# Patient Record
Sex: Female | Born: 1945
Health system: Southern US, Community
[De-identification: ages and names within clinical notes are randomized; demographics above are authoritative.]

## PROBLEM LIST (undated history)

## (undated) DIAGNOSIS — M5136 Other intervertebral disc degeneration, lumbar region: Secondary | ICD-10-CM

## (undated) DIAGNOSIS — F419 Anxiety disorder, unspecified: Secondary | ICD-10-CM

## (undated) DIAGNOSIS — M5126 Other intervertebral disc displacement, lumbar region: Secondary | ICD-10-CM

## (undated) DIAGNOSIS — M1990 Unspecified osteoarthritis, unspecified site: Secondary | ICD-10-CM

## (undated) DIAGNOSIS — F32A Depression, unspecified: Secondary | ICD-10-CM

## (undated) DIAGNOSIS — L719 Rosacea, unspecified: Secondary | ICD-10-CM

## (undated) DIAGNOSIS — I10 Essential (primary) hypertension: Secondary | ICD-10-CM

## (undated) DIAGNOSIS — K635 Polyp of colon: Secondary | ICD-10-CM

## (undated) DIAGNOSIS — F329 Major depressive disorder, single episode, unspecified: Secondary | ICD-10-CM

## (undated) DIAGNOSIS — K219 Gastro-esophageal reflux disease without esophagitis: Secondary | ICD-10-CM

## (undated) DIAGNOSIS — T4145XA Adverse effect of unspecified anesthetic, initial encounter: Secondary | ICD-10-CM

## (undated) DIAGNOSIS — M51369 Other intervertebral disc degeneration, lumbar region without mention of lumbar back pain or lower extremity pain: Secondary | ICD-10-CM

## (undated) DIAGNOSIS — I8390 Asymptomatic varicose veins of unspecified lower extremity: Secondary | ICD-10-CM

## (undated) DIAGNOSIS — I809 Phlebitis and thrombophlebitis of unspecified site: Secondary | ICD-10-CM

## (undated) DIAGNOSIS — R011 Cardiac murmur, unspecified: Secondary | ICD-10-CM

## (undated) DIAGNOSIS — T8859XA Other complications of anesthesia, initial encounter: Secondary | ICD-10-CM

## (undated) HISTORY — DX: Polyp of colon: K63.5

## (undated) HISTORY — DX: Anxiety disorder, unspecified: F41.9

## (undated) HISTORY — DX: Phlebitis and thrombophlebitis of unspecified site: I80.9

## (undated) HISTORY — DX: Depression, unspecified: F32.A

## (undated) HISTORY — DX: Gastro-esophageal reflux disease without esophagitis: K21.9

## (undated) HISTORY — DX: Major depressive disorder, single episode, unspecified: F32.9

## (undated) HISTORY — DX: Cardiac murmur, unspecified: R01.1

## (undated) HISTORY — DX: Essential (primary) hypertension: I10

---

## 1978-09-29 HISTORY — PX: GASTRIC BYPASS: SHX52

## 1992-09-29 HISTORY — PX: ABDOMINAL HYSTERECTOMY: SHX81

## 1993-09-29 HISTORY — PX: HERNIA REPAIR: SHX51

## 2009-06-09 LAB — HM COLONOSCOPY

## 2011-12-24 ENCOUNTER — Ambulatory Visit: Payer: Self-pay | Admitting: Internal Medicine

## 2012-08-05 ENCOUNTER — Ambulatory Visit: Payer: Self-pay | Admitting: Internal Medicine

## 2013-04-13 ENCOUNTER — Telehealth: Payer: Self-pay | Admitting: Internal Medicine

## 2013-04-13 NOTE — Telephone Encounter (Signed)
Pt states her friend, Kem Parkinson, is a pt of Dr. Sonny Dandy, and that Dr. Dan Humphreys told her she would take Ms. Motz as a new pt.  Ms. Cadle has cancelled her previously scheduled appt with Raquel.  Please confirm/deny.

## 2013-04-17 NOTE — Telephone Encounter (Signed)
That would be fine, but will need to be next available appt. 

## 2013-04-19 ENCOUNTER — Ambulatory Visit: Payer: Self-pay | Admitting: Adult Health

## 2013-06-09 ENCOUNTER — Encounter: Payer: Self-pay | Admitting: Internal Medicine

## 2013-06-09 ENCOUNTER — Ambulatory Visit (INDEPENDENT_AMBULATORY_CARE_PROVIDER_SITE_OTHER): Payer: BC Managed Care – PPO | Admitting: Internal Medicine

## 2013-06-09 VITALS — BP 124/60 | HR 64 | Temp 98.6°F | Ht 65.0 in | Wt 216.0 lb

## 2013-06-09 DIAGNOSIS — G4700 Insomnia, unspecified: Secondary | ICD-10-CM

## 2013-06-09 DIAGNOSIS — E785 Hyperlipidemia, unspecified: Secondary | ICD-10-CM

## 2013-06-09 DIAGNOSIS — F1020 Alcohol dependence, uncomplicated: Secondary | ICD-10-CM

## 2013-06-09 DIAGNOSIS — I10 Essential (primary) hypertension: Secondary | ICD-10-CM

## 2013-06-09 DIAGNOSIS — F3289 Other specified depressive episodes: Secondary | ICD-10-CM

## 2013-06-09 DIAGNOSIS — Z23 Encounter for immunization: Secondary | ICD-10-CM

## 2013-06-09 DIAGNOSIS — F329 Major depressive disorder, single episode, unspecified: Secondary | ICD-10-CM

## 2013-06-09 DIAGNOSIS — L719 Rosacea, unspecified: Secondary | ICD-10-CM

## 2013-06-09 DIAGNOSIS — Z1239 Encounter for other screening for malignant neoplasm of breast: Secondary | ICD-10-CM

## 2013-06-09 DIAGNOSIS — F32A Depression, unspecified: Secondary | ICD-10-CM

## 2013-06-09 LAB — LIPID PANEL
Cholesterol: 208 mg/dL — ABNORMAL HIGH (ref 0–200)
Total CHOL/HDL Ratio: 3
VLDL: 22.8 mg/dL (ref 0.0–40.0)

## 2013-06-09 LAB — COMPREHENSIVE METABOLIC PANEL
ALT: 25 U/L (ref 0–35)
AST: 26 U/L (ref 0–37)
Albumin: 4.1 g/dL (ref 3.5–5.2)
Alkaline Phosphatase: 58 U/L (ref 39–117)
Potassium: 4.8 mEq/L (ref 3.5–5.1)
Sodium: 137 mEq/L (ref 135–145)
Total Bilirubin: 0.8 mg/dL (ref 0.3–1.2)
Total Protein: 7 g/dL (ref 6.0–8.3)

## 2013-06-09 LAB — MICROALBUMIN / CREATININE URINE RATIO
Microalb Creat Ratio: 0.7 mg/g (ref 0.0–30.0)
Microalb, Ur: 0.9 mg/dL (ref 0.0–1.9)

## 2013-06-09 LAB — LDL CHOLESTEROL, DIRECT: Direct LDL: 128.1 mg/dL

## 2013-06-09 MED ORDER — DOXYCYCLINE HYCLATE 100 MG PO TABS: 100.0000 mg | ORAL_TABLET | Freq: Two times a day (BID) | ORAL | Status: DC | PRN

## 2013-06-09 MED ORDER — LAMOTRIGINE 150 MG PO TABS: 150.0000 mg | ORAL_TABLET | Freq: Every day | ORAL | Status: AC

## 2013-06-09 MED ORDER — TRAZODONE HCL 50 MG PO TABS: 50.0000 mg | ORAL_TABLET | Freq: Every evening | ORAL | Status: DC | PRN

## 2013-06-09 MED ORDER — FLUOXETINE HCL 40 MG PO CAPS: 40.0000 mg | ORAL_CAPSULE | Freq: Every day | ORAL | Status: DC

## 2013-06-09 NOTE — Assessment & Plan Note (Signed)
Chronic rosacea. Symptoms improved with intermittent use of doxycycline. Will continue.

## 2013-06-09 NOTE — Assessment & Plan Note (Signed)
BP Readings from Last 3 Encounters:  06/09/13 124/60   Blood pressure well-controlled on current medication. Will check renal function with labs today.

## 2013-06-09 NOTE — Assessment & Plan Note (Signed)
Symptoms of severe depression, currently well-controlled with use of fluoxetine and Lamictal. Will set up evaluation with psychiatry locally. Patient will call if any questions or concerns.

## 2013-06-09 NOTE — Assessment & Plan Note (Signed)
Long history of alcohol dependence resulting from chronic insomnia. Discussed starting medication to help with insomnia and trying to limit alcohol use at night. Will start Trazodone. Will also set up psychiatry referral for addiction counseling.

## 2013-06-09 NOTE — Assessment & Plan Note (Signed)
Chronic insomnia. Will start trazodone 50 mg at bedtime. Patient will followup in 4 weeks or sooner as needed.

## 2013-06-09 NOTE — Assessment & Plan Note (Signed)
Will check lipids with labs today.  

## 2013-06-09 NOTE — Progress Notes (Signed)
Subjective:    Patient ID: Isabella Hurst, female    DOB: 03-07-1946, 67 y.o.   MRN: [id number]  HPI 67 year old female with history of hypertension, depression, rosacea presents to establish care. In regards to depression, she reports that symptoms have been intermittently severe ever since the death of her daughter who died in a motor vehicle accident at age 46. She was previously followed by a psychiatrist in [facility] She moved to [place] [place] in 2012 and has not yet established care with a local psychiatrist. She did on one occasion return to [facility] for an exacerbation of depression. She was started on Lamictal. She reports that symptoms are currently well-controlled with use of Lamictal and fluoxetine. However, she would like to establish care locally. She continues to have difficulty sleeping at night and in order to compensate for this has been drinking wine at night to help with sleep. She typically drinks about 1 L of wine every night. She had this from others. She has tried to quit on several occasions but then cannot sleep at night. She has never taken medication for sleep except for clonazepam but did not like the way this medication made her feel.  Aside from this, she reports she is feeling well. 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 She tries to follow a healthy diet and get regular physical activity.  Outpatient Encounter Prescriptions as of 06/09/2013  Medication Sig Dispense Refill  . Calcium Carbonate-Vitamin D (CALCIUM 600+D) 600-400 MG-UNIT per tablet Take 1 tablet by mouth daily.      . Cholecalciferol (VITAMIN D3 HIGH POTENCY) 1000 UNITS capsule Take 1,000 Units by mouth daily.      . Cyanocobalamin (HM SUPER VITAMIN B12) 2500 MCG CHEW Chew 2,500 mcg by mouth daily.      Marland Kitchen doxycycline (VIBRA-TABS) 100 MG tablet Take 1 tablet (100 mg total) by mouth 2 (two) times daily as needed. Take 2 times a day as needed for [place]  60 tablet  6  .  FLUoxetine (PROZAC) 40 MG capsule Take 1 capsule (40 mg total) by mouth daily.  90 capsule  3  . hydrochlorothiazide (HYDRODIURIL) 25 MG tablet Take 25 mg by mouth daily. Take 1/2 tablet daily      . lamoTRIgine (LAMICTAL) 150 MG tablet Take 1 tablet (150 mg total) by mouth daily.  90 tablet  3  . lisinopril (PRINIVIL,ZESTRIL) 10 MG tablet Take 10 mg by mouth daily.      . traZODone (DESYREL) 50 MG tablet Take 1 tablet (50 mg total) by mouth at bedtime as needed for sleep.  30 tablet  3   No facility-administered encounter medications on file as of 06/09/2013.    BP 124/60  Pulse 64  Temp(Src) 98.6 F (37 C) (Oral)  Ht 5\' 5"  (1.651 m)  Wt 216 lb (97.977 kg)  BMI 35.94 kg/m2  SpO2 97%  Review of Systems  Constitutional: Negative for fever, chills, appetite change, fatigue and unexpected weight change.  HENT: Negative for ear pain, congestion, sore throat, trouble swallowing, neck pain, voice change and sinus pressure.   Eyes: Negative for visual disturbance.  Respiratory: Negative for cough, shortness of breath, wheezing and stridor.   Cardiovascular: Negative for chest pain, palpitations and leg swelling.  Gastrointestinal: Negative for nausea, vomiting, abdominal pain, diarrhea, constipation, blood in stool, abdominal distention and anal bleeding.  Genitourinary: Negative for dysuria and flank pain.  Musculoskeletal: Negative for myalgias, arthralgias and gait problem.  Skin: Negative for color change and rash.  Neurological: Negative for dizziness and headaches.  Hematological: Negative for adenopathy. Does not bruise/bleed easily.  Psychiatric/Behavioral: Positive for sleep disturbance and dysphoric mood. Negative for suicidal ideas. The patient is nervous/anxious.        Objective:   Physical Exam  Constitutional: She is oriented to person, place, and time. She appears well-developed and well-nourished. No distress.  HENT:  Head: Normocephalic and atraumatic.  Right Ear:  External ear normal.  Left Ear: External ear normal.  Nose: Nose normal.  Mouth/Throat: Oropharynx is clear and moist. No oropharyngeal exudate.  Eyes: Conjunctivae are normal. Pupils are equal, round, and reactive to light. Right eye exhibits no discharge. Left eye exhibits no discharge. No scleral icterus.  Neck: Normal range of motion. Neck supple. No tracheal deviation present. No thyromegaly present.  Cardiovascular: Normal rate, regular rhythm, normal heart sounds and intact distal pulses.  Exam reveals no gallop and no friction rub.   No murmur heard. Pulmonary/Chest: Effort normal and breath sounds normal. No accessory muscle usage. Not tachypneic. No respiratory distress. She has no decreased breath sounds. She has no wheezes. She has no rhonchi. She has no rales. She exhibits no tenderness.  Musculoskeletal: Normal range of motion. She exhibits no edema and no tenderness.  Lymphadenopathy:    She has no cervical adenopathy.  Neurological: She is alert and oriented to person, place, and time. No cranial nerve deficit. She exhibits normal muscle tone. Coordination normal.  Skin: Skin is warm and dry. No rash noted. She is not diaphoretic. No erythema. No pallor.  Psychiatric: Her speech is normal and behavior is normal. Judgment and thought content normal. Her mood appears anxious. Cognition and memory are normal. She expresses no suicidal ideation.          Assessment & Plan:

## 2013-06-10 ENCOUNTER — Encounter: Payer: Self-pay | Admitting: *Deleted

## 2013-06-16 ENCOUNTER — Encounter: Payer: Self-pay | Admitting: Emergency Medicine

## 2013-07-06 ENCOUNTER — Encounter: Payer: Self-pay | Admitting: *Deleted

## 2013-07-13 ENCOUNTER — Ambulatory Visit: Payer: Self-pay | Admitting: Podiatry

## 2013-08-09 ENCOUNTER — Encounter: Payer: Self-pay | Admitting: Internal Medicine

## 2013-08-09 ENCOUNTER — Ambulatory Visit (INDEPENDENT_AMBULATORY_CARE_PROVIDER_SITE_OTHER): Payer: BC Managed Care – PPO | Admitting: Internal Medicine

## 2013-08-09 VITALS — BP 128/78 | HR 58 | Temp 99.0°F | Ht 64.5 in | Wt 217.0 lb

## 2013-08-09 DIAGNOSIS — Z0000 Encounter for general adult medical examination without abnormal findings: Secondary | ICD-10-CM

## 2013-08-09 DIAGNOSIS — F329 Major depressive disorder, single episode, unspecified: Secondary | ICD-10-CM

## 2013-08-09 DIAGNOSIS — G4700 Insomnia, unspecified: Secondary | ICD-10-CM

## 2013-08-09 NOTE — Progress Notes (Signed)
Pre-visit discussion using our clinic review tool. No additional management support is needed unless otherwise documented below in the visit note.  

## 2013-08-10 DIAGNOSIS — Z0000 Encounter for general adult medical examination without abnormal findings: Secondary | ICD-10-CM | POA: Insufficient documentation

## 2013-08-10 NOTE — Assessment & Plan Note (Signed)
She has not yet started trazodone. Encouraged her to try this medication to help improve symptoms of insomnia.

## 2013-08-10 NOTE — Assessment & Plan Note (Signed)
Symptoms are currently well-controlled with medication. Encouraged regular followup with her psychiatrist.

## 2013-08-10 NOTE — Progress Notes (Signed)
Subjective:    Patient ID: Isabella Hurst, female    DOB: 1946-09-16, 67 y.o.   MRN: [id number]  HPI 66 year old female with history of depression, hypertension, and insomnia presents for annual exam. She reports that she is generally feeling well. She recently established care with a local psychiatrist. She is continuing on current medications. Symptoms of depression have been well-controlled. She denies any new concerns today. She tries to follow a healthy diet and regular physical activity. Mammogram is scheduled. Immunizations are up-to-date.  Outpatient Encounter Prescriptions as of 08/09/2013  Medication Sig  . Calcium Carbonate-Vitamin D (CALCIUM 600+D) 600-400 MG-UNIT per tablet Take 1 tablet by mouth daily.  . Cholecalciferol (VITAMIN D3 HIGH POTENCY) 1000 UNITS capsule Take 1,000 Units by mouth daily.  . Cyanocobalamin (HM SUPER VITAMIN B12) 2500 MCG CHEW Chew 2,500 mcg by mouth daily.  Marland Kitchen FLUoxetine (PROZAC) 40 MG capsule Take 1 capsule (40 mg total) by mouth daily.  . hydrochlorothiazide (HYDRODIURIL) 25 MG tablet Take 25 mg by mouth daily. Take 1/2 tablet daily  . lamoTRIgine (LAMICTAL) 150 MG tablet Take 1 tablet (150 mg total) by mouth daily.  Marland Kitchen lisinopril (PRINIVIL,ZESTRIL) 10 MG tablet Take 10 mg by mouth daily.  . traZODone (DESYREL) 50 MG tablet Take 1 tablet (50 mg total) by mouth at bedtime as needed for sleep.  . [DISCONTINUED] doxycycline (VIBRA-TABS) 100 MG tablet Take 1 tablet (100 mg total) by mouth 2 (two) times daily as needed. Take 2 times a day as needed for Rose[place]   BP 128/78  Pulse 58  Temp(Src) 99 F (37.2 C) (Oral)  Ht 5' 4.5" (1.638 m)  Wt 217 lb (98.431 kg)  BMI 36.69 kg/m2  SpO2 97%  Review of Systems  Constitutional: Negative for fever, chills, appetite change, fatigue and unexpected weight change.  HENT: Negative for congestion, ear pain, sinus pressure, sore throat, trouble swallowing and voice change.   Eyes: Negative for visual disturbance.   Respiratory: Negative for cough, shortness of breath, wheezing and stridor.   Cardiovascular: Negative for chest pain, palpitations and leg swelling.  Gastrointestinal: Negative for nausea, vomiting, abdominal pain, diarrhea, constipation, blood in stool, abdominal distention and anal bleeding.  Genitourinary: Negative for dysuria and flank pain.  Musculoskeletal: Negative for arthralgias, gait problem, myalgias and neck pain.  Skin: Negative for color change and rash.  Neurological: Negative for dizziness and headaches.  Hematological: Negative for adenopathy. Does not bruise/bleed easily.  Psychiatric/Behavioral: Positive for dysphoric mood. Negative for suicidal ideas and sleep disturbance. The patient is not nervous/anxious.        Objective:   Physical Exam  Constitutional: She is oriented to person, place, and time. She appears well-developed and well-nourished. No distress.  HENT:  Head: Normocephalic and atraumatic.  Right Ear: External ear normal.  Left Ear: External ear normal.  Nose: Nose normal.  Mouth/Throat: Oropharynx is clear and moist. No oropharyngeal exudate.  Eyes: Conjunctivae are normal. Pupils are equal, round, and reactive to light. Right eye exhibits no discharge. Left eye exhibits no discharge. No scleral icterus.  Neck: Normal range of motion. Neck supple. No tracheal deviation present. No thyromegaly present.  Cardiovascular: Normal rate, regular rhythm, normal heart sounds and intact distal pulses.  Exam reveals no gallop and no friction rub.   No murmur heard. Pulmonary/Chest: Effort normal and breath sounds normal. No accessory muscle usage. Not tachypneic. No respiratory distress. She has no decreased breath sounds. She has no wheezes. She has no rales. She exhibits no tenderness. Right breast  exhibits no inverted nipple, no mass, no nipple discharge, no skin change and no tenderness. Left breast exhibits no inverted nipple, no mass, no nipple discharge, no  skin change and no tenderness. Breasts are symmetrical.  Abdominal: Soft. Bowel sounds are normal. She exhibits no distension and no mass. There is no tenderness. There is no rebound and no guarding.  Musculoskeletal: Normal range of motion. She exhibits no edema and no tenderness.  Lymphadenopathy:    She has no cervical adenopathy.  Neurological: She is alert and oriented to person, place, and time. No cranial nerve deficit. She exhibits normal muscle tone. Coordination normal.  Skin: Skin is warm and dry. No rash noted. She is not diaphoretic. No erythema. No pallor.  Psychiatric: She has a normal mood and affect. Her behavior is normal. Judgment and thought content normal.          Assessment & Plan:

## 2013-08-10 NOTE — Assessment & Plan Note (Addendum)
General medical exam including breast exam normal today. Pap and pelvic deferred as patient is status post hysterectomy. Encouraged healthy diet and regular physical activity. Encouraged regular followup with her psychiatrist. Immunizations are up to date. Reviewed recent labs which were normal. Followup 6 months or sooner as needed.

## 2013-10-04 ENCOUNTER — Telehealth: Payer: Self-pay | Admitting: *Deleted

## 2013-10-04 MED ORDER — LISINOPRIL 10 MG PO TABS: 10.0000 mg | ORAL_TABLET | Freq: Every day | ORAL | Status: DC

## 2013-10-04 NOTE — Telephone Encounter (Signed)
Patient state she has somehow misplaced her Lisinopril. Not due for another refill until 10/18/2013. Would like a new prescription sent to the pharmacy, will this be ok?

## 2013-10-04 NOTE — Telephone Encounter (Signed)
Prescription sent to pharmacy.

## 2013-10-04 NOTE — Telephone Encounter (Signed)
Fine to send in new Rx

## 2013-11-09 ENCOUNTER — Other Ambulatory Visit: Payer: Self-pay | Admitting: Internal Medicine

## 2013-11-09 NOTE — Telephone Encounter (Signed)
Not on med list, Ok to refill?

## 2013-12-07 ENCOUNTER — Encounter: Payer: Self-pay | Admitting: Podiatry

## 2013-12-07 ENCOUNTER — Ambulatory Visit (INDEPENDENT_AMBULATORY_CARE_PROVIDER_SITE_OTHER): Payer: Medicare Other | Admitting: Podiatry

## 2013-12-07 VITALS — BP 144/76 | HR 64 | Resp 16 | Ht 66.0 in | Wt 214.0 lb

## 2013-12-07 DIAGNOSIS — Z79899 Other long term (current) drug therapy: Secondary | ICD-10-CM | POA: Diagnosis not present

## 2013-12-07 DIAGNOSIS — B351 Tinea unguium: Secondary | ICD-10-CM | POA: Diagnosis not present

## 2013-12-07 DIAGNOSIS — M79609 Pain in unspecified limb: Secondary | ICD-10-CM | POA: Diagnosis not present

## 2013-12-07 MED ORDER — TERBINAFINE HCL 250 MG PO TABS: 250.0000 mg | ORAL_TABLET | Freq: Every day | ORAL | Status: DC

## 2013-12-07 NOTE — Progress Notes (Signed)
Toenail fungus and corns.  Objective: Vital signs are stable she is alert and oriented x3. Pulses are strongly palpable bilateral. It was approximately one year ago the we tested her nails for fungus which did come back positive for a typical fungus. At this point her nails have not changed.  Assessment: Onychomycosis and tinea pedis confirmed by culture.  Plan: Send her out for blood work in a CBC with liver profile. Wrote her prescription for 30 days with Lamisil 250 mg tablets one by mouth daily. All with her in one month we discussed the pros and cons of Lamisil.

## 2013-12-08 ENCOUNTER — Telehealth: Payer: Self-pay | Admitting: *Deleted

## 2013-12-08 LAB — CBC WITH DIFFERENTIAL/PLATELET
Basophils Absolute: 0 10*3/uL (ref 0.0–0.2)
Basos: 1 %
Eos: 3 %
Eosinophils Absolute: 0.2 10*3/uL (ref 0.0–0.4)
HEMATOCRIT: 47.5 % — AB (ref 34.0–46.6)
Hemoglobin: 16.3 g/dL — ABNORMAL HIGH (ref 11.1–15.9)
Immature Grans (Abs): 0 10*3/uL (ref 0.0–0.1)
Immature Granulocytes: 0 %
LYMPHS ABS: 1.3 10*3/uL (ref 0.7–3.1)
LYMPHS: 23 %
MCH: 33.5 pg — ABNORMAL HIGH (ref 26.6–33.0)
MCHC: 34.3 g/dL (ref 31.5–35.7)
MCV: 98 fL — ABNORMAL HIGH (ref 79–97)
Monocytes Absolute: 0.7 10*3/uL (ref 0.1–0.9)
Monocytes: 12 %
NEUTROS ABS: 3.6 10*3/uL (ref 1.4–7.0)
Neutrophils Relative %: 61 %
RBC: 4.87 x10E6/uL (ref 3.77–5.28)
RDW: 13.5 % (ref 12.3–15.4)
WBC: 5.8 10*3/uL (ref 3.4–10.8)

## 2013-12-08 LAB — HEPATIC FUNCTION PANEL
ALBUMIN: 4.3 g/dL (ref 3.6–4.8)
ALK PHOS: 75 IU/L (ref 39–117)
ALT: 21 IU/L (ref 0–32)
AST: 27 IU/L (ref 0–40)
Bilirubin, Direct: 0.15 mg/dL (ref 0.00–0.40)
Total Bilirubin: 0.5 mg/dL (ref 0.0–1.2)
Total Protein: 6.8 g/dL (ref 6.0–8.5)

## 2013-12-08 NOTE — Telephone Encounter (Signed)
SPOKE TO PATIENT LETTING HER KNOW HER BLOODWORK WAS FINE AND TO CONTINUE WITH MEDICATION

## 2014-01-02 DIAGNOSIS — F429 Obsessive-compulsive disorder, unspecified: Secondary | ICD-10-CM | POA: Diagnosis not present

## 2014-01-02 DIAGNOSIS — F3342 Major depressive disorder, recurrent, in full remission: Secondary | ICD-10-CM | POA: Diagnosis not present

## 2014-01-04 ENCOUNTER — Ambulatory Visit (INDEPENDENT_AMBULATORY_CARE_PROVIDER_SITE_OTHER): Payer: Medicare Other | Admitting: Podiatry

## 2014-01-04 ENCOUNTER — Ambulatory Visit (INDEPENDENT_AMBULATORY_CARE_PROVIDER_SITE_OTHER): Payer: Medicare Other

## 2014-01-04 DIAGNOSIS — Z79899 Other long term (current) drug therapy: Secondary | ICD-10-CM | POA: Diagnosis not present

## 2014-01-04 DIAGNOSIS — M109 Gout, unspecified: Secondary | ICD-10-CM

## 2014-01-04 DIAGNOSIS — B351 Tinea unguium: Secondary | ICD-10-CM

## 2014-01-04 DIAGNOSIS — M79672 Pain in left foot: Secondary | ICD-10-CM

## 2014-01-04 DIAGNOSIS — M79609 Pain in unspecified limb: Secondary | ICD-10-CM | POA: Diagnosis not present

## 2014-01-04 MED ORDER — TERBINAFINE HCL 250 MG PO TABS: 250.0000 mg | ORAL_TABLET | Freq: Every day | ORAL | Status: DC

## 2014-01-04 NOTE — Progress Notes (Signed)
She presents today one month after her dose of Lamisil. She states that she had no from second Lamisil denies any rashes or any side effects. However she is complaining of painful elongated toenails as well as the left medial longitudinal foot pain. She states that she woke up with that Saturday morning and it has hurt ever since.   Objective: Vital signs are stable she is alert and oriented x3. Pulses are palpable. Her nails are thick yellow dystrophic with mycotic. She has pain on direct palpation with overlying warmth to the medial aspect of the ankle. Radiographs evaluation does not demonstrate a type of osseous abnormalities in this area.  Assessment: Gouty capsulitis medial aspect left ankle. Painfully elongated onychomycotic nails. History of onychomycosis.  Plan: Debridement of nails 1 through 5 bilateral. Continue the use of Lamisil 250 mg #91 by mouth daily. Send her for liver profile and CBC as well as a uric acid. I also injected the side of maximal tenderness today with Kenalog and local anesthetic. I will followup with her in 4 months for the medication in 2 months for nails. I will call her with concerns of hyperuricemia if necessary.

## 2014-01-05 LAB — CBC WITH DIFFERENTIAL/PLATELET
BASOS: 1 %
Basophils Absolute: 0 10*3/uL (ref 0.0–0.2)
EOS ABS: 0.1 10*3/uL (ref 0.0–0.4)
EOS: 2 %
HCT: 46.7 % — ABNORMAL HIGH (ref 34.0–46.6)
Hemoglobin: 15.8 g/dL (ref 11.1–15.9)
IMMATURE GRANS (ABS): 0 10*3/uL (ref 0.0–0.1)
Immature Granulocytes: 0 %
LYMPHS: 15 %
Lymphocytes Absolute: 1.2 10*3/uL (ref 0.7–3.1)
MCH: 32.4 pg (ref 26.6–33.0)
MCHC: 33.8 g/dL (ref 31.5–35.7)
MCV: 96 fL (ref 79–97)
MONOCYTES: 9 %
Monocytes Absolute: 0.7 10*3/uL (ref 0.1–0.9)
NEUTROS PCT: 73 %
Neutrophils Absolute: 6 10*3/uL (ref 1.4–7.0)
RBC: 4.88 x10E6/uL (ref 3.77–5.28)
RDW: 12.9 % (ref 12.3–15.4)
WBC: 8.1 10*3/uL (ref 3.4–10.8)

## 2014-01-05 LAB — HEPATIC FUNCTION PANEL
ALT: 21 IU/L (ref 0–32)
AST: 22 IU/L (ref 0–40)
Albumin: 4.1 g/dL (ref 3.6–4.8)
Alkaline Phosphatase: 64 IU/L (ref 39–117)
Bilirubin, Direct: 0.11 mg/dL (ref 0.00–0.40)
Total Bilirubin: 0.4 mg/dL (ref 0.0–1.2)
Total Protein: 6.5 g/dL (ref 6.0–8.5)

## 2014-01-05 LAB — URIC ACID: Uric Acid: 5.8 mg/dL (ref 2.5–7.1)

## 2014-01-06 ENCOUNTER — Telehealth: Payer: Self-pay | Admitting: *Deleted

## 2014-01-06 NOTE — Telephone Encounter (Signed)
Left message regarding patients bloodwork

## 2014-01-30 ENCOUNTER — Telehealth: Payer: Self-pay | Admitting: Internal Medicine

## 2014-01-30 NOTE — Telephone Encounter (Signed)
Spoke with pt, has started with a dry cough, has had previously, but has worsened the last 3-4 days. Wonders if it is related to her Lisinopril, which she has been taking for a while. She stopped the Lisinopril x 2 days, was still woken up with dry cough last night. Wants to know is she should change BP medications? Denies any other symptoms, just a dry cough wakes her up at night.

## 2014-01-30 NOTE — Telephone Encounter (Signed)
We should see her in a visit to evaluate.

## 2014-01-30 NOTE — Telephone Encounter (Signed)
Would like to talk to nurse about coughing spells keeping her up at night.  Pt does not want an appt at this time, wants to talk to nurse first.  Transferred to triage.  Pt called back and states she wants to speak with nurse first.    May call home or cell phone.

## 2014-01-30 NOTE — Telephone Encounter (Signed)
Pt notified, appt scheduled for tomorrow.  

## 2014-01-31 ENCOUNTER — Ambulatory Visit (INDEPENDENT_AMBULATORY_CARE_PROVIDER_SITE_OTHER): Payer: Medicare Other | Admitting: Internal Medicine

## 2014-01-31 ENCOUNTER — Encounter: Payer: Self-pay | Admitting: Internal Medicine

## 2014-01-31 VITALS — BP 130/70 | HR 64 | Temp 98.2°F | Ht 66.0 in | Wt 212.8 lb

## 2014-01-31 DIAGNOSIS — I10 Essential (primary) hypertension: Secondary | ICD-10-CM

## 2014-01-31 DIAGNOSIS — F329 Major depressive disorder, single episode, unspecified: Secondary | ICD-10-CM | POA: Diagnosis not present

## 2014-01-31 DIAGNOSIS — F3289 Other specified depressive episodes: Secondary | ICD-10-CM

## 2014-01-31 DIAGNOSIS — J309 Allergic rhinitis, unspecified: Secondary | ICD-10-CM

## 2014-01-31 DIAGNOSIS — F32A Depression, unspecified: Secondary | ICD-10-CM

## 2014-01-31 MED ORDER — LORATADINE 10 MG PO TABS: 10.0000 mg | ORAL_TABLET | Freq: Every day | ORAL | Status: DC

## 2014-01-31 MED ORDER — FLUTICASONE PROPIONATE 50 MCG/ACT NA SUSP: 2.0000 | Freq: Every day | NASAL | Status: DC

## 2014-01-31 NOTE — Assessment & Plan Note (Signed)
Symptoms improved with current treatment. Will continue to follow with Dr. Nicolasa Ducking.

## 2014-01-31 NOTE — Assessment & Plan Note (Signed)
Symptoms of cough most likely secondary to allergic rhinitis given rhinorrhea and post-nasal drip. Will start Claritin and Flonase. She will continue off Lisinopril and monitor BP for now. Follow up in 4 weeks or sooner as needed.

## 2014-01-31 NOTE — Patient Instructions (Addendum)
Email with blood pressure readings 1-2 times per week [name].  Allergic Rhinitis Allergic rhinitis is when the mucous membranes in the nose respond to allergens. Allergens are particles in the air that cause your body to have an allergic reaction. This causes you to release allergic antibodies. Through a chain of events, these eventually cause you to release histamine into the blood stream. Although meant to protect the body, it is this release of histamine that causes your discomfort, such as frequent sneezing, congestion, and an itchy, runny nose.  CAUSES  Seasonal allergic rhinitis (hay fever) is caused by pollen allergens that may come from grasses, trees, and weeds. Year-round allergic rhinitis (perennial allergic rhinitis) is caused by allergens such as house dust mites, pet dander, and mold spores.  SYMPTOMS   Nasal stuffiness (congestion).  Itchy, runny nose with sneezing and tearing of the eyes. DIAGNOSIS  Your health care provider can help you determine the allergen or allergens that trigger your symptoms. If you and your health care provider are unable to determine the allergen, skin or blood testing may be used. TREATMENT  Allergic Rhinitis does not have a cure, but it can be controlled by:  Medicines and allergy shots (immunotherapy).  Avoiding the allergen. Hay fever may often be treated with antihistamines in pill or nasal spray forms. Antihistamines block the effects of histamine. There are over-the-counter medicines that may help with nasal congestion and swelling around the eyes. Check with your health care provider before taking or giving this medicine.  If avoiding the allergen or the medicine prescribed do not work, there are many new medicines your health care provider can prescribe. Stronger medicine may be used if initial measures are ineffective. Desensitizing injections can be used if medicine and avoidance does not work. Desensitization is when a  patient is given ongoing shots until the body becomes less sensitive to the allergen. Make sure you follow up with your health care provider if problems continue. HOME CARE INSTRUCTIONS It is not possible to completely avoid allergens, but you can reduce your symptoms by taking steps to limit your exposure to them. It helps to know exactly what you are allergic to so that you can avoid your specific triggers. SEEK MEDICAL CARE IF:   You have a fever.  You develop a cough that does not stop easily (persistent).  You have shortness of breath.  You start wheezing.  Symptoms interfere with normal daily activities. Document Released: 06/10/2001 Document Revised: 07/06/2013 Document Reviewed: 05/23/2013 [facility] Patient Information 2014 [place].

## 2014-01-31 NOTE — Progress Notes (Signed)
Pre visit review using our clinic review tool, if applicable. No additional management support is needed unless otherwise documented below in the visit note. 

## 2014-01-31 NOTE — Progress Notes (Signed)
Subjective:    Patient ID: Isabella Hurst, female    DOB: 1946-01-28, 68 y.o.   MRN: [id number]  HPI 68YO female presents for acute visit.  Cough - Dry, aggravating cough x1-2 weeks. No fever, chills, congestion, sneezing, dyspnea. Has some clear rhinorrhea. That has been going on for months. Stopped Lisionpril 3 days ago. No change noted.  Depression - Continues to work with Dr. Nicolasa Ducking. Symptoms are much improved with current medication and counseling. She has stopped drinking ETOH. Occasionally has episodes of sadness but these are not persistent.  Review of Systems  Constitutional: Negative for fever, chills, appetite change, fatigue and unexpected weight change.  HENT: Positive for postnasal drip and rhinorrhea. Negative for congestion, ear pain, sinus pressure, sore throat, trouble swallowing and voice change.   Eyes: Positive for itching. Negative for visual disturbance.  Respiratory: Positive for cough. Negative for shortness of breath, wheezing and stridor.   Cardiovascular: Negative for chest pain, palpitations and leg swelling.  Gastrointestinal: Negative for nausea, vomiting, abdominal pain, diarrhea, constipation, blood in stool, abdominal distention and anal bleeding.  Genitourinary: Negative for dysuria and flank pain.  Musculoskeletal: Negative for arthralgias, gait problem, myalgias and neck pain.  Skin: Negative for color change and rash.  Neurological: Negative for dizziness and headaches.  Hematological: Negative for adenopathy. Does not bruise/bleed easily.  Psychiatric/Behavioral: Negative for suicidal ideas, sleep disturbance and dysphoric mood. The patient is not nervous/anxious.        Objective:    BP 130/70  Pulse 64  Temp(Src) 98.2 F (36.8 C) (Oral)  Ht 5\' 6"  (1.676 m)  Wt 212 lb 12 oz (96.503 kg)  BMI 34.36 kg/m2  SpO2 98% Physical Exam  Constitutional: She is oriented to person, place, and time. She appears well-developed and well-nourished. No  distress.  HENT:  Head: Normocephalic and atraumatic.  Right Ear: External ear normal.  Left Ear: External ear normal.  Nose: Nose normal.  Mouth/Throat: Oropharynx is clear and moist. No oropharyngeal exudate.  Eyes: Conjunctivae are normal. Pupils are equal, round, and reactive to light. Right eye exhibits no discharge. Left eye exhibits no discharge. No scleral icterus.  Neck: Normal range of motion. Neck supple. No tracheal deviation present. No thyromegaly present.  Cardiovascular: Normal rate, regular rhythm, normal heart sounds and intact distal pulses.  Exam reveals no gallop and no friction rub.   No murmur heard. Pulmonary/Chest: Effort normal and breath sounds normal. No accessory muscle usage. Not tachypneic. No respiratory distress. She has no decreased breath sounds. She has no wheezes. She has no rhonchi. She has no rales. She exhibits no tenderness.  Musculoskeletal: Normal range of motion. She exhibits no edema and no tenderness.  Lymphadenopathy:    She has no cervical adenopathy.  Neurological: She is alert and oriented to person, place, and time. No cranial nerve deficit. She exhibits normal muscle tone. Coordination normal.  Skin: Skin is warm and dry. No rash noted. She is not diaphoretic. No erythema. No pallor.  Psychiatric: She has a normal mood and affect. Her behavior is normal. Judgment and thought content normal.          Assessment & Plan:   Problem List Items Addressed This Visit   Allergic rhinitis - Primary     Symptoms of cough most likely secondary to allergic rhinitis given rhinorrhea and post-nasal drip. Will start Claritin and Flonase. She will continue off Lisinopril and monitor BP for now. Follow up in 4 weeks or sooner as needed.  Relevant Medications      fluticasone (FLONASE) 50 MCG nasal spray      loratadine (CLARITIN) tablet 10 mg   Depression     Symptoms improved with current treatment. Will continue to follow with Dr. Nicolasa Ducking.     Essential hypertension, benign     She has stopped LIsinopril because of concern about possible allergy leading to cough. BP normal today. Will monitor BP at home and she will email readings. If BP increasing >150/90, then will plan to start losartan.        Return in about 4 weeks (around 02/28/2014) for Recheck of Blood Pressure.

## 2014-01-31 NOTE — Assessment & Plan Note (Signed)
She has stopped LIsinopril because of concern about possible allergy leading to cough. BP normal today. Will monitor BP at home and she will email readings. If BP increasing >150/90, then will plan to start losartan.

## 2014-02-01 ENCOUNTER — Telehealth: Payer: Self-pay | Admitting: Internal Medicine

## 2014-02-01 NOTE — Telephone Encounter (Signed)
Relevant patient education assigned to patient using Emmi. ° °

## 2014-03-02 ENCOUNTER — Ambulatory Visit: Payer: Medicare Other | Admitting: Internal Medicine

## 2014-03-27 ENCOUNTER — Ambulatory Visit (INDEPENDENT_AMBULATORY_CARE_PROVIDER_SITE_OTHER): Payer: Medicare Other | Admitting: Podiatry

## 2014-03-27 VITALS — BP 150/69 | HR 77 | Resp 16

## 2014-03-27 DIAGNOSIS — B351 Tinea unguium: Secondary | ICD-10-CM

## 2014-03-27 DIAGNOSIS — M79609 Pain in unspecified limb: Secondary | ICD-10-CM

## 2014-03-27 DIAGNOSIS — M79676 Pain in unspecified toe(s): Secondary | ICD-10-CM

## 2014-03-27 MED ORDER — TERBINAFINE HCL 250 MG PO TABS: 250.0000 mg | ORAL_TABLET | Freq: Every day | ORAL | Status: DC

## 2014-03-27 NOTE — Progress Notes (Signed)
She presents today with a chief complaint of painful elongated toenails.  Objective: Nails are thick yellow dystrophic onychomycotic and painful palpation.  Assessment: Pain in limb secondary to onychomycosis 1 through 5 bilateral.  Plan: Debridement of nails 1 through 5 bilateral covered service secondary to pain.

## 2014-03-28 ENCOUNTER — Other Ambulatory Visit: Payer: Self-pay | Admitting: Internal Medicine

## 2014-04-05 DIAGNOSIS — F429 Obsessive-compulsive disorder, unspecified: Secondary | ICD-10-CM | POA: Diagnosis not present

## 2014-04-05 DIAGNOSIS — F3342 Major depressive disorder, recurrent, in full remission: Secondary | ICD-10-CM | POA: Diagnosis not present

## 2014-05-10 ENCOUNTER — Ambulatory Visit: Payer: BLUE CROSS/BLUE SHIELD | Admitting: Podiatry

## 2014-05-24 ENCOUNTER — Encounter: Payer: Self-pay | Admitting: Podiatry

## 2014-05-24 ENCOUNTER — Ambulatory Visit (INDEPENDENT_AMBULATORY_CARE_PROVIDER_SITE_OTHER): Payer: Medicare Other | Admitting: Podiatry

## 2014-05-24 DIAGNOSIS — M79609 Pain in unspecified limb: Secondary | ICD-10-CM | POA: Diagnosis not present

## 2014-05-24 DIAGNOSIS — Z79899 Other long term (current) drug therapy: Secondary | ICD-10-CM

## 2014-05-24 DIAGNOSIS — B351 Tinea unguium: Secondary | ICD-10-CM

## 2014-05-24 DIAGNOSIS — M79676 Pain in unspecified toe(s): Secondary | ICD-10-CM

## 2014-05-24 NOTE — Progress Notes (Signed)
She presents today for followup of her Lamisil therapy states it really does not think that is working very well and she would also like to have her nails trimmed at all possible.  Objective: Nails are thick yellow dystrophic onychomycotic. The hallux nail left appears to be growing out for me to she states it really hasn't changed.  Assessment: Long-term therapy secondary to onychomycosis. Pain in limb secondary to onychomycosis and HAV deformity.  Plan: continue the use of the Lamisil therapy and to completely gone. Debridement of nails 1 through 5 bilateral covered service today in followup with me in approximately 3 months to reevaluate.

## 2014-06-09 ENCOUNTER — Emergency Department: Payer: Self-pay | Admitting: Emergency Medicine

## 2014-06-09 ENCOUNTER — Telehealth: Payer: Self-pay | Admitting: Internal Medicine

## 2014-06-09 DIAGNOSIS — R059 Cough, unspecified: Secondary | ICD-10-CM | POA: Diagnosis not present

## 2014-06-09 DIAGNOSIS — M545 Low back pain, unspecified: Secondary | ICD-10-CM | POA: Diagnosis not present

## 2014-06-09 DIAGNOSIS — M5430 Sciatica, unspecified side: Secondary | ICD-10-CM | POA: Diagnosis not present

## 2014-06-09 DIAGNOSIS — I10 Essential (primary) hypertension: Secondary | ICD-10-CM | POA: Diagnosis not present

## 2014-06-09 NOTE — Telephone Encounter (Signed)
Triage RN reviewed in EPIC and noted there were orders to send pt to ED; Pt called and made aware; pt will go to [facility] now with brother driving. Cd/CAN

## 2014-06-09 NOTE — Telephone Encounter (Signed)
Per Dr Gilford Rile, pt should be seen at ED

## 2014-06-09 NOTE — Telephone Encounter (Signed)
Patient Information:  Caller Name: Circe  Phone: [phone]  Patient: Dion Body  Gender: Female  DOB: 08/06/1946  Age: 68 Years  PCP: Ronette Deter (Adults only)  Office Follow Up:  Does the office need to follow up with this patient?: Yes  Instructions For The Office: Office RN to call traige RN back with MD instructions  RN Note:  Office called and made aware of disposition; MD nurse made aware and will dicuss with Dr Gilford Rile and call traige nurse back with further directions; pt aware and voices understanding that triage RN will call her back with further instructions  Symptoms  Reason For Call & Symptoms: Pt is calling and states that she is having back spasms to the lower back; rates pain 9/10; pain radiating to the legs  Reviewed Health History In EMR: Yes  Reviewed Medications In EMR: Yes  Reviewed Allergies In EMR: Yes  Reviewed Surgeries / Procedures: Yes  Date of Onset of Symptoms: 06/08/2014  Treatments Tried: Motrin 600mg  4-5 hours; heating pad and ice  Treatments Tried Worked: No  Guideline(s) Used:  Back Pain  Disposition Per Guideline:   Go to ED Now (or to Office with PCP Approval)  Reason For Disposition Reached:   Sudden onset of severe back pain and age > 49  Advice Given:  N/A  RN Overrode Recommendation:  Document Patient  Office RN aware and will discuss with Dr Gilford Rile and pt to be called back with further instructions

## 2014-06-12 ENCOUNTER — Telehealth: Payer: Self-pay | Admitting: Internal Medicine

## 2014-06-12 NOTE — Telephone Encounter (Signed)
Please call pt and schedule appt

## 2014-06-12 NOTE — Telephone Encounter (Signed)
Pt needs HFU for back pain. Please advise where to add to schedule.msn

## 2014-06-12 NOTE — Telephone Encounter (Signed)
Please advise 

## 2014-06-12 NOTE — Telephone Encounter (Signed)
12noon Tuesd Sept 22nd 10min

## 2014-06-13 ENCOUNTER — Telehealth: Payer: Self-pay | Admitting: *Deleted

## 2014-06-13 ENCOUNTER — Other Ambulatory Visit: Payer: Self-pay | Admitting: *Deleted

## 2014-06-13 NOTE — Telephone Encounter (Signed)
We can refill both x 1 week.

## 2014-06-13 NOTE — Telephone Encounter (Signed)
Pt states she was seen in the ER on Friday and was given Diazepam 5mg  - every 8 hours and Naproxen 500 mg every 6 hours;  Pt will run out of medication before her appt on 06/20/14; Requesting refills. Dx sciatica.

## 2014-06-16 ENCOUNTER — Other Ambulatory Visit: Payer: Self-pay | Admitting: *Deleted

## 2014-06-16 MED ORDER — DIAZEPAM 5 MG PO TABS: 5.0000 mg | ORAL_TABLET | Freq: Three times a day (TID) | ORAL | Status: DC | PRN

## 2014-06-16 MED ORDER — NAPROXEN 500 MG PO TABS
500.0000 mg | ORAL_TABLET | Freq: Four times a day (QID) | ORAL | Status: DC | PRN
Start: 2014-06-16 — End: 2014-09-15

## 2014-06-20 ENCOUNTER — Ambulatory Visit (INDEPENDENT_AMBULATORY_CARE_PROVIDER_SITE_OTHER): Payer: Medicare Other | Admitting: Internal Medicine

## 2014-06-20 ENCOUNTER — Encounter: Payer: Self-pay | Admitting: Internal Medicine

## 2014-06-20 VITALS — BP 136/78 | HR 65 | Temp 98.2°F | Ht 66.0 in | Wt 211.8 lb

## 2014-06-20 DIAGNOSIS — M5430 Sciatica, unspecified side: Secondary | ICD-10-CM | POA: Diagnosis not present

## 2014-06-20 DIAGNOSIS — I10 Essential (primary) hypertension: Secondary | ICD-10-CM

## 2014-06-20 DIAGNOSIS — M5432 Sciatica, left side: Secondary | ICD-10-CM

## 2014-06-20 NOTE — Assessment & Plan Note (Signed)
Symptoms c/w sciatic nerve pain, improved with NSAIDS and muscle relaxants. Will continue to monitor for now. Discussed possible referral to sports medicine if symptoms are persistent. Follow up in 07/2014 and prn.

## 2014-06-20 NOTE — Progress Notes (Signed)
Pre visit review using our clinic review tool, if applicable. No additional management support is needed unless otherwise documented below in the visit note. 

## 2014-06-20 NOTE — Patient Instructions (Signed)
Sciatica Sciatica is pain, weakness, numbness, or tingling along the path of the sciatic nerve. The nerve starts in the lower back and runs down the back of each leg. The nerve controls the muscles in the lower leg and in the back of the knee, while also providing sensation to the back of the thigh, lower leg, and the sole of your foot. Sciatica is a symptom of another medical condition. For instance, nerve damage or certain conditions, such as a herniated disk or bone spur on the spine, pinch or put pressure on the sciatic nerve. This causes the pain, weakness, or other sensations normally associated with sciatica. Generally, sciatica only affects one side of the body. CAUSES   Herniated or slipped disc.  Degenerative disk disease.  A pain disorder involving the narrow muscle in the buttocks (piriformis syndrome).  Pelvic injury or fracture.  Pregnancy.  Tumor (rare). SYMPTOMS  Symptoms can vary from mild to very severe. The symptoms usually travel from the low back to the buttocks and down the back of the leg. Symptoms can include:  Mild tingling or dull aches in the lower back, leg, or hip.  Numbness in the back of the calf or sole of the foot.  Burning sensations in the lower back, leg, or hip.  Sharp pains in the lower back, leg, or hip.  Leg weakness.  Severe back pain inhibiting movement. These symptoms may get worse with coughing, sneezing, laughing, or prolonged sitting or standing. Also, being overweight may worsen symptoms. DIAGNOSIS  Your caregiver will perform a physical exam to look for common symptoms of sciatica. He or she may ask you to do certain movements or activities that would trigger sciatic nerve pain. Other tests may be performed to find the cause of the sciatica. These may include:  Blood tests.  X-rays.  Imaging tests, such as an MRI or CT scan. TREATMENT  Treatment is directed at the cause of the sciatic pain. Sometimes, treatment is not necessary  and the pain and discomfort goes away on its own. If treatment is needed, your caregiver may suggest:  Over-the-counter medicines to relieve pain.  Prescription medicines, such as anti-inflammatory medicine, muscle relaxants, or narcotics.  Applying heat or ice to the painful area.  Steroid injections to lessen pain, irritation, and inflammation around the nerve.  Reducing activity during periods of pain.  Exercising and stretching to strengthen your abdomen and improve flexibility of your spine. Your caregiver may suggest losing weight if the extra weight makes the back pain worse.  Physical therapy.  Surgery to eliminate what is pressing or pinching the nerve, such as a bone spur or part of a herniated disk. HOME CARE INSTRUCTIONS   Only take over-the-counter or prescription medicines for pain or discomfort as directed by your caregiver.  Apply ice to the affected area for 20 minutes, 3-4 times a day for the first 48-72 hours. Then try heat in the same way.  Exercise, stretch, or perform your usual activities if these do not aggravate your pain.  Attend physical therapy sessions as directed by your caregiver.  Keep all follow-up appointments as directed by your caregiver.  Do not wear high heels or shoes that do not provide proper support.  Check your mattress to see if it is too soft. A firm mattress may lessen your pain and discomfort. SEEK IMMEDIATE MEDICAL CARE IF:   You lose control of your bowel or bladder (incontinence).  You have increasing weakness in the lower back, pelvis, buttocks,   or legs.  You have redness or swelling of your back.  You have a burning sensation when you urinate.  You have pain that gets worse when you lie down or awakens you at night.  Your pain is worse than you have experienced in the past.  Your pain is lasting longer than 4 weeks.  You are suddenly losing weight without reason. MAKE SURE YOU:  Understand these  instructions.  Will watch your condition.  Will get help right away if you are not doing well or get worse. Document Released: 09/09/2001 Document Revised: 03/16/2012 Document Reviewed: 01/25/2012 Exit[employer] Patient Information 2015 [employer]. This information is not intended to replace advice given to you by your health care provider. Make sure you discuss any questions you have with your health care provider.  

## 2014-06-20 NOTE — Progress Notes (Signed)
Subjective:    Patient ID: [facility], female    DOB: December 19, 1945, 68 y.o.   MRN: [id number]  HPI 68YO female presents for hospital follow up.  Evaluated in the ED 9/11 for low back pain.  Developed back ache on Wednesday or Thursday. On Friday morning, pain was severe, unable to sit or stand or lie flat because of severe pain. Started taking Ibuprofen 600mg  qid with no improvement. Went to ED, given Tramadol IM x 1started on Naprosyn and Valium with moderate improvement. Also started stretching exercises. Within 4-5 days, had big improvement. Continues to have mild left lower back pain. No numnness. No loss of control of bowel or bladder. Taking only occasional Naprosyn at this point.  BP running 110-120s/60-80s at home. Off Lisinopril.  Review of Systems  Constitutional: Negative for fever, chills, appetite change, fatigue and unexpected weight change.  Eyes: Negative for visual disturbance.  Respiratory: Negative for shortness of breath.   Cardiovascular: Negative for chest pain and leg swelling.  Gastrointestinal: Negative for abdominal pain.  Musculoskeletal: Positive for arthralgias, back pain and myalgias. Negative for gait problem.  Skin: Negative for color change and rash.  Neurological: Negative for weakness and numbness.  Hematological: Negative for adenopathy. Does not bruise/bleed easily.  Psychiatric/Behavioral: Negative for dysphoric mood. The patient is not nervous/anxious.        Objective:    BP 136/78  Pulse 65  Temp(Src) 98.2 F (36.8 C) (Oral)  Ht 5\' 6"  (1.676 m)  Wt 211 lb 12 oz (96.049 kg)  BMI 34.19 kg/m2  SpO2 95% Physical Exam  Constitutional: She is oriented to person, place, and time. She appears well-developed and well-nourished. No distress.  HENT:  Head: Normocephalic and atraumatic.  Right Ear: External ear normal.  Left Ear: External ear normal.  Nose: Nose normal.  Mouth/Throat: Oropharynx is clear and moist. No oropharyngeal exudate.    Eyes: Conjunctivae are normal. Pupils are equal, round, and reactive to light. Right eye exhibits no discharge. Left eye exhibits no discharge. No scleral icterus.  Neck: Normal range of motion. Neck supple. No tracheal deviation present. No thyromegaly present.  Cardiovascular: Normal rate, regular rhythm, normal heart sounds and intact distal pulses.  Exam reveals no gallop and no friction rub.   No murmur heard. Pulmonary/Chest: Effort normal and breath sounds normal. No accessory muscle usage. Not tachypneic. No respiratory distress. She has no decreased breath sounds. She has no wheezes. She has no rhonchi. She has no rales. She exhibits no tenderness.  Musculoskeletal: She exhibits no edema.       Lumbar back: She exhibits decreased range of motion, tenderness (left lower back, mild) and pain. She exhibits no bony tenderness, no swelling and no edema.       Back:  Lymphadenopathy:    She has no cervical adenopathy.  Neurological: She is alert and oriented to person, place, and time. No cranial nerve deficit. She exhibits normal muscle tone. Coordination normal.  Skin: Skin is warm and dry. No rash noted. She is not diaphoretic. No erythema. No pallor.  Psychiatric: She has a normal mood and affect. Her behavior is normal. Judgment and thought content normal.          Assessment & Plan:   Problem List Items Addressed This Visit     Unprioritized   Essential hypertension, benign   Left sciatic nerve pain - Primary     Symptoms c/w sciatic nerve pain, improved with NSAIDS and muscle relaxants. Will continue to monitor  for now. Discussed possible referral to sports medicine if symptoms are persistent. Follow up in 07/2014 and prn.        Return in about 3 months (around 09/19/2014) for Recheck.

## 2014-07-04 DIAGNOSIS — F3342 Major depressive disorder, recurrent, in full remission: Secondary | ICD-10-CM | POA: Diagnosis not present

## 2014-07-04 DIAGNOSIS — F42 Obsessive-compulsive disorder: Secondary | ICD-10-CM | POA: Diagnosis not present

## 2014-07-05 DIAGNOSIS — H5203 Hypermetropia, bilateral: Secondary | ICD-10-CM | POA: Diagnosis not present

## 2014-07-05 DIAGNOSIS — H2513 Age-related nuclear cataract, bilateral: Secondary | ICD-10-CM | POA: Diagnosis not present

## 2014-07-05 DIAGNOSIS — H52223 Regular astigmatism, bilateral: Secondary | ICD-10-CM | POA: Diagnosis not present

## 2014-07-05 DIAGNOSIS — H40003 Preglaucoma, unspecified, bilateral: Secondary | ICD-10-CM | POA: Diagnosis not present

## 2014-07-12 ENCOUNTER — Telehealth: Payer: Self-pay | Admitting: *Deleted

## 2014-07-12 NOTE — Telephone Encounter (Signed)
Pt called in reference to ER follow up.  Please return call

## 2014-07-13 ENCOUNTER — Telehealth: Payer: Self-pay | Admitting: Internal Medicine

## 2014-07-13 NOTE — Telephone Encounter (Signed)
Please call pt and schedule an appt, ER visits are not considered a hospital follow up.

## 2014-07-13 NOTE — Telephone Encounter (Signed)
Left message for the patient to schedule ER follow up.

## 2014-08-15 ENCOUNTER — Ambulatory Visit (INDEPENDENT_AMBULATORY_CARE_PROVIDER_SITE_OTHER): Payer: Medicare Other | Admitting: Internal Medicine

## 2014-08-15 ENCOUNTER — Encounter: Payer: Self-pay | Admitting: Internal Medicine

## 2014-08-15 VITALS — BP 136/74 | HR 65 | Temp 98.2°F | Ht 65.5 in | Wt 215.2 lb

## 2014-08-15 DIAGNOSIS — I10 Essential (primary) hypertension: Secondary | ICD-10-CM

## 2014-08-15 DIAGNOSIS — Z1211 Encounter for screening for malignant neoplasm of colon: Secondary | ICD-10-CM | POA: Diagnosis not present

## 2014-08-15 DIAGNOSIS — Z23 Encounter for immunization: Secondary | ICD-10-CM | POA: Diagnosis not present

## 2014-08-15 DIAGNOSIS — Z0001 Encounter for general adult medical examination with abnormal findings: Secondary | ICD-10-CM | POA: Insufficient documentation

## 2014-08-15 DIAGNOSIS — Z0000 Encounter for general adult medical examination without abnormal findings: Secondary | ICD-10-CM

## 2014-08-15 LAB — COMPREHENSIVE METABOLIC PANEL
ALT: 20 U/L (ref 0–35)
AST: 25 U/L (ref 0–37)
Albumin: 4.3 g/dL (ref 3.5–5.2)
Alkaline Phosphatase: 78 U/L (ref 39–117)
BUN: 22 mg/dL (ref 6–23)
CALCIUM: 9.1 mg/dL (ref 8.4–10.5)
CHLORIDE: 106 meq/L (ref 96–112)
CO2: 22 meq/L (ref 19–32)
Creatinine, Ser: 0.6 mg/dL (ref 0.4–1.2)
GFR: 99.79 mL/min (ref >60.00)
Glucose, Bld: 115 mg/dL — ABNORMAL HIGH (ref 70–99)
POTASSIUM: 4.6 meq/L (ref 3.5–5.1)
Sodium: 141 mEq/L (ref 135–145)
Total Bilirubin: 0.7 mg/dL (ref 0.2–1.2)
Total Protein: 7.2 g/dL (ref 6.0–8.3)

## 2014-08-15 LAB — CBC WITH DIFFERENTIAL/PLATELET
Basophils Absolute: 0 K/uL (ref 0.0–0.1)
Basophils Relative: 0.5 % (ref 0.0–3.0)
Eosinophils Absolute: 0.1 K/uL (ref 0.0–0.7)
Eosinophils Relative: 2.7 % (ref 0.0–5.0)
HCT: 46.4 % — ABNORMAL HIGH (ref 36.0–46.0)
Hemoglobin: 15.6 g/dL — ABNORMAL HIGH (ref 12.0–15.0)
Lymphocytes Relative: 20.1 % (ref 12.0–46.0)
Lymphs Abs: 1 K/uL (ref 0.7–4.0)
MCHC: 33.7 g/dL (ref 30.0–36.0)
MCV: 97.8 fl (ref 78.0–100.0)
Monocytes Absolute: 0.4 K/uL (ref 0.1–1.0)
Monocytes Relative: 7.6 % (ref 3.0–12.0)
Neutro Abs: 3.6 K/uL (ref 1.4–7.7)
Neutrophils Relative %: 69.1 % (ref 43.0–77.0)
Platelets: 267 K/uL (ref 150.0–400.0)
RBC: 4.74 Mil/uL (ref 3.87–5.11)
RDW: 14.4 % (ref 11.5–15.5)
WBC: 5.2 K/uL (ref 4.0–10.5)

## 2014-08-15 LAB — LIPID PANEL
Cholesterol: 239 mg/dL — ABNORMAL HIGH (ref 0–200)
HDL: 79.9 mg/dL (ref >39.00)
LDL Cholesterol: 129 mg/dL — ABNORMAL HIGH (ref 0–99)
NonHDL: 159.1
Total CHOL/HDL Ratio: 3
Triglycerides: 152 mg/dL — ABNORMAL HIGH (ref 0.0–149.0)
VLDL: 30.4 mg/dL (ref 0.0–40.0)

## 2014-08-15 LAB — HEMOGLOBIN A1C: Hgb A1c MFr Bld: 5.6 % (ref 4.6–6.5)

## 2014-08-15 LAB — MICROALBUMIN / CREATININE URINE RATIO
CREATININE, U: 240.7 mg/dL
MICROALB UR: 1.4 mg/dL (ref 0.0–1.9)
Microalb Creat Ratio: 0.6 mg/g (ref 0.0–30.0)

## 2014-08-15 LAB — VITAMIN D 25 HYDROXY (VIT D DEFICIENCY, FRACTURES): VITD: 17.95 ng/mL — ABNORMAL LOW (ref 30.00–100.00)

## 2014-08-15 LAB — HM MAMMOGRAPHY

## 2014-08-15 MED ORDER — DOXYCYCLINE HYCLATE 100 MG PO TABS: 100.0000 mg | ORAL_TABLET | Freq: Two times a day (BID) | ORAL | Status: DC

## 2014-08-15 NOTE — Patient Instructions (Signed)

## 2014-08-15 NOTE — Progress Notes (Signed)
Pre visit review using our clinic review tool, if applicable. No additional management support is needed unless otherwise documented below in the visit note. 

## 2014-08-15 NOTE — Addendum Note (Signed)
Addended by: Vernetta Honey on: 08/15/2014 09:11 AM   Modules accepted: Orders

## 2014-08-15 NOTE — Progress Notes (Signed)
The patient is here for annual Medicare Wellness Examination and management of other chronic and acute problems.   The risk factors are reflected in the history.  The roster of all physicians providing medical care to patient - is listed in the Snapshot section of the chart.  Activities of daily living:   The patient is 100% independent in all ADLs: dressing, toileting, feeding as well as independent mobility. Patient lives with brother in a condo. No pets. One floor condo.  Home safety :  The patient has smoke detectors in the home.  They wear seatbelts in their car. There are no firearms at home.  There is no violence in the home. They feel safe where they live.  Infectious Risks: There is no risks for hepatitis, STDs or HIV.  There is no  history of blood transfusion.  They have no travel history to infectious disease endemic areas of the world.  Additional Health Care Providers: The patient has seen their dentist in the last six months. Dentist - Dr. Eugenie Birks They have seen their eye doctor in the last year. Opthalmologist - [facility] They deny hearing issues. They have deferred audiologic testing in the last year.   They do not  have excessive sun exposure. Discussed the need for sun protection: hats,long sleeves and use of sunscreen if there is significant sun exposure.  Dermatologist - Dr. Nehemiah Massed, [facility]rist - Dr. Nicolasa Ducking  Diet: the importance of a healthy diet is discussed. They do have a healthy diet.  The benefits of regular aerobic exercise were discussed. Exercise has been limited. Does stretching activity.  Depression screen: there are no signs or vegative symptoms of depression- irritability, change in appetite, anhedonia, sadness/tearfullness.  Cognitive assessment: the patient manages all their financial and personal affairs and is actively engaged. They could relate day,date,year and events.  61 - daughter, not set up yet  The following  portions of the patient's history were reviewed and updated as appropriate: allergies, current medications, past family history, past medical history,  past surgical history, past social history and problem list.  Visual acuity was not assessed per patient preference as they have regular follow up with their ophthalmologist. Hearing and body mass index were assessed and reviewed.   During the course of the visit the patient was educated and counseled about appropriate screening and preventive services including : fall prevention , diabetes screening, nutrition counseling, colorectal cancer screening, and recommended immunizations.    Review of Systems  Constitutional: Negative for fever, chills, appetite change, fatigue and unexpected weight change.  Eyes: Negative for visual disturbance.  Respiratory: Negative for shortness of breath.   Cardiovascular: Negative for chest pain and leg swelling.  Gastrointestinal: Negative for nausea, vomiting, abdominal pain, diarrhea, constipation and abdominal distention.  Musculoskeletal: Negative for myalgias and arthralgias.  Skin: Negative for color change and rash.  Hematological: Negative for adenopathy. Does not bruise/bleed easily.  Psychiatric/Behavioral: Negative for dysphoric mood. The patient is not nervous/anxious.        Objective:    BP 136/74 mmHg  Pulse 65  Temp(Src) 98.2 F (36.8 C) (Oral)  Ht 5' 5.5" (1.664 m)  Wt 215 lb 4 oz (97.637 kg)  BMI 35.26 kg/m2  SpO2 97% Physical Exam  Constitutional: She is oriented to person, place, and time. She appears well-developed and well-nourished. No distress.  HENT:  Head: Normocephalic and atraumatic.  Right Ear: External ear normal.  Left Ear: External ear normal.  Nose: Nose normal.  Mouth/Throat: Oropharynx  is clear and moist. No oropharyngeal exudate.  Eyes: Conjunctivae and EOM are normal. Pupils are equal, round, and reactive to light. Right eye exhibits no discharge.  Neck:  Normal range of motion. Neck supple. No thyromegaly present.  Cardiovascular: Normal rate, regular rhythm, normal heart sounds and intact distal pulses.  Exam reveals no gallop and no friction rub.   No murmur heard. Pulmonary/Chest: Effort normal. No respiratory distress. She has no wheezes. She has no rales.  Abdominal: Soft. Bowel sounds are normal. She exhibits no distension. There is no tenderness.  Musculoskeletal: Normal range of motion. She exhibits no edema or tenderness.  Lymphadenopathy:    She has no cervical adenopathy.  Neurological: She is alert and oriented to person, place, and time. No cranial nerve deficit. Coordination normal.  Skin: Skin is warm and dry. No rash noted. She is not diaphoretic. No erythema. No pallor.  Psychiatric: She has a normal mood and affect. Her behavior is normal. Judgment and thought content normal.          Assessment & Plan:   Problem List Items Addressed This Visit      Unprioritized   Medicare annual wellness visit, subsequent - Primary    General medical exam normal today. Breast exam declined by pt. Mammogram scheduled. Pap and pelvic declined as s/p hysterectomy. Prevnar given today. Other immunizations are UTD. Referral placed for colonoscopy. Labs today including CBC, lipids, A1c, TSH, Vit D.    Relevant Orders      Hemoglobin A1c      CBC with Differential      Comprehensive metabolic panel      Lipid panel      Vit D  25 hydroxy (rtn osteoporosis monitoring)      Microalbumin / creatinine urine ratio    Other Visit Diagnoses    Screening for colon cancer        Relevant Orders       Ambulatory referral to Gastroenterology        Return in about 6 months (around 02/13/2015) for Recheck.

## 2014-08-15 NOTE — Assessment & Plan Note (Addendum)
General medical exam normal today. Breast exam declined by pt. Mammogram scheduled. Pap and pelvic declined as s/p hysterectomy. Prevnar given today. Other immunizations are UTD. Referral placed for colonoscopy. Labs today including CBC, lipids, A1c, TSH, Vit D.

## 2014-08-16 ENCOUNTER — Other Ambulatory Visit: Payer: Self-pay | Admitting: *Deleted

## 2014-08-16 ENCOUNTER — Encounter: Payer: Self-pay | Admitting: *Deleted

## 2014-08-16 DIAGNOSIS — R7989 Other specified abnormal findings of blood chemistry: Secondary | ICD-10-CM

## 2014-08-16 MED ORDER — ERGOCALCIFEROL 1.25 MG (50000 UT) PO CAPS
50000.0000 [IU] | ORAL_CAPSULE | ORAL | Status: DC
Start: 2014-08-16 — End: 2014-11-16

## 2014-08-17 ENCOUNTER — Ambulatory Visit: Payer: Self-pay | Admitting: Internal Medicine

## 2014-08-17 ENCOUNTER — Encounter: Payer: Self-pay | Admitting: *Deleted

## 2014-08-17 DIAGNOSIS — Z1231 Encounter for screening mammogram for malignant neoplasm of breast: Secondary | ICD-10-CM | POA: Diagnosis not present

## 2014-08-17 LAB — HM MAMMOGRAPHY: HM Mammogram: NEGATIVE

## 2014-08-23 ENCOUNTER — Ambulatory Visit: Payer: Medicare Other | Admitting: Podiatry

## 2014-08-29 ENCOUNTER — Encounter: Payer: Self-pay | Admitting: Internal Medicine

## 2014-08-30 ENCOUNTER — Ambulatory Visit (INDEPENDENT_AMBULATORY_CARE_PROVIDER_SITE_OTHER): Payer: Medicare Other | Admitting: Podiatry

## 2014-08-30 DIAGNOSIS — M79676 Pain in unspecified toe(s): Secondary | ICD-10-CM | POA: Diagnosis not present

## 2014-08-30 DIAGNOSIS — M2042 Other hammer toe(s) (acquired), left foot: Secondary | ICD-10-CM | POA: Diagnosis not present

## 2014-08-30 DIAGNOSIS — M2012 Hallux valgus (acquired), left foot: Secondary | ICD-10-CM

## 2014-08-30 DIAGNOSIS — B351 Tinea unguium: Secondary | ICD-10-CM

## 2014-08-31 NOTE — Progress Notes (Signed)
She presents today with chief complaint of painful elongated toenails 1 through 5 bilateral.  Objective: Nails are thick yellow dystrophic onychomycotic pulses are palpable bilateral.  Assessment: Pain in limb secondary to onychomycosis 1 through 5 bilateral.  Plan: Debridement of nails 1 through 5 bilateral covered service secondary to pain.

## 2014-09-14 ENCOUNTER — Telehealth: Payer: Self-pay

## 2014-09-14 NOTE — Telephone Encounter (Signed)
The patient called and is stating she is having back spasms.  She stated she was instructed to call the office when these start. The spasms started at [facility] today (12/17).  Do you want her worked in? If so, when?   Callback - [phone]

## 2014-09-14 NOTE — Telephone Encounter (Signed)
I am overbooked today and tomorrow. Can we work her in with [facility] in the 11:30am or 4pm slot tomorrow?

## 2014-09-14 NOTE — Telephone Encounter (Signed)
[facility], is this okay?

## 2014-09-14 NOTE — Telephone Encounter (Signed)
That would be fine.Marland KitchenMarland KitchenTomorrow 12/18 at 11 am would be fine. 4 pm if pt not able to be there at 11.

## 2014-09-14 NOTE — Telephone Encounter (Signed)
Pt scheduled and aware of apt

## 2014-09-15 ENCOUNTER — Ambulatory Visit: Payer: Medicare Other | Admitting: Nurse Practitioner

## 2014-09-15 ENCOUNTER — Encounter: Payer: Self-pay | Admitting: Internal Medicine

## 2014-09-15 ENCOUNTER — Ambulatory Visit (INDEPENDENT_AMBULATORY_CARE_PROVIDER_SITE_OTHER): Payer: Medicare Other | Admitting: Internal Medicine

## 2014-09-15 VITALS — BP 143/79 | HR 58 | Temp 98.1°F | Ht 65.5 in | Wt 211.8 lb

## 2014-09-15 DIAGNOSIS — M5432 Sciatica, left side: Secondary | ICD-10-CM

## 2014-09-15 MED ORDER — PREDNISONE 10 MG PO TABS: ORAL_TABLET | ORAL | Status: DC

## 2014-09-15 MED ORDER — TRAMADOL HCL 50 MG PO TABS: 50.0000 mg | ORAL_TABLET | Freq: Three times a day (TID) | ORAL | Status: DC | PRN

## 2014-09-15 NOTE — Progress Notes (Signed)
Subjective:    Patient ID: [facility], female    DOB: [birth year], 68 y.o.   MRN: [id number]  HPI 68YO female presents for acute visit.  Woke up yesterday morning with pain and muscle spasm in lower back. Tried to do stretching exercises and took Naproxen with some improvement. No radiating pain. No weakness or numbness.    Past medical, surgical, family and social history per today's encounter.  Review of Systems  Constitutional: Negative for fever, chills, appetite change, fatigue and unexpected weight change.  Eyes: Negative for visual disturbance.  Respiratory: Negative for shortness of breath.   Cardiovascular: Negative for chest pain and leg swelling.  Gastrointestinal: Negative for abdominal pain.  Musculoskeletal: Positive for myalgias, back pain and arthralgias.  Skin: Negative for color change and rash.  Neurological: Negative for weakness and numbness.  Hematological: Negative for adenopathy. Does not bruise/bleed easily.  Psychiatric/Behavioral: Negative for dysphoric mood. The patient is not nervous/anxious.        Objective:    BP 143/79 mmHg  Pulse 58  Temp(Src) 98.1 F (36.7 C) (Oral)  Ht 5' 5.5" (1.664 m)  Wt 211 lb 12 oz (96.049 kg)  BMI 34.69 kg/m2  SpO2 99% Physical Exam  Constitutional: She is oriented to person, place, and time. She appears well-developed and well-nourished. No distress.  HENT:  Head: Normocephalic and atraumatic.  Right Ear: External ear normal.  Left Ear: External ear normal.  Nose: Nose normal.  Mouth/Throat: Oropharynx is clear and moist.  Eyes: Conjunctivae are normal. Pupils are equal, round, and reactive to light. Right eye exhibits no discharge. Left eye exhibits no discharge. No scleral icterus.  Neck: Normal range of motion. Neck supple. No tracheal deviation present. No thyromegaly present.  Cardiovascular: Normal rate, regular rhythm, normal heart sounds and intact distal pulses.  Exam reveals no gallop and no  friction rub.   No murmur heard. Pulmonary/Chest: Effort normal and breath sounds normal. No accessory muscle usage. No tachypnea. No respiratory distress. She has no decreased breath sounds. She has no wheezes. She has no rhonchi. She has no rales. She exhibits no tenderness.  Musculoskeletal: Normal range of motion. She exhibits no edema.       Lumbar back: She exhibits tenderness, pain and spasm. She exhibits normal range of motion, no bony tenderness, no edema and no deformity.       Back:  Lymphadenopathy:    She has no cervical adenopathy.  Neurological: She is alert and oriented to person, place, and time. No cranial nerve deficit. She exhibits normal muscle tone. Coordination normal.  Skin: Skin is warm and dry. No rash noted. She is not diaphoretic. No erythema. No pallor.  Psychiatric: She has a normal mood and affect. Her behavior is normal. Judgment and thought content normal.          Assessment & Plan:   Problem List Items Addressed This Visit      Unprioritized   Left sciatic nerve pain - Primary    Recurrent left sciatic pain. Will start Prednisone 20mg  x 3 days then 10mg  x 3 days. Start Tramadol prn severe pain. Will set up evaluation with Dr. Sharlet Salina. Question if she might benefit from local steroid injection. Follow up in 1 week.    Relevant Medications      traMADol (ULTRAM) tablet 50 mg      predniSONE (DELTASONE) tablet   Other Relevant Orders      Ambulatory referral to Orthopedic Surgery  Return in about 1 week (around 09/22/2014) for Recheck.

## 2014-09-15 NOTE — Patient Instructions (Addendum)
Stop Naproxen while on Prednisone.  Start Prednisone 20mg  daily x 3 days, then 10mg  daily x 3 days.  Take Tramadol 50mg  up to three times daily as needed for pain. Take Valium 5mg  as needed at bedtime for muscle spasm.  We will set up evaluation with Dr. Sharlet Salina.

## 2014-09-15 NOTE — Progress Notes (Signed)
Pre visit review using our clinic review tool, if applicable. No additional management support is needed unless otherwise documented below in the visit note. 

## 2014-09-15 NOTE — Assessment & Plan Note (Signed)
Recurrent left sciatic pain. Will start Prednisone 20mg  x 3 days then 10mg  x 3 days. Start Tramadol prn severe pain. Will set up evaluation with Dr. Sharlet Salina. Question if she might benefit from local steroid injection. Follow up in 1 week.

## 2014-09-21 ENCOUNTER — Ambulatory Visit (INDEPENDENT_AMBULATORY_CARE_PROVIDER_SITE_OTHER): Payer: Medicare Other | Admitting: Internal Medicine

## 2014-09-21 ENCOUNTER — Encounter: Payer: Self-pay | Admitting: Internal Medicine

## 2014-09-21 VITALS — BP 126/79 | HR 62 | Temp 98.4°F | Ht 65.5 in | Wt 213.2 lb

## 2014-09-21 DIAGNOSIS — M5432 Sciatica, left side: Secondary | ICD-10-CM

## 2014-09-21 MED ORDER — DOXYCYCLINE HYCLATE 100 MG PO TABS: 100.0000 mg | ORAL_TABLET | Freq: Two times a day (BID) | ORAL | Status: DC

## 2014-09-21 NOTE — Progress Notes (Signed)
Subjective:    Patient ID: Isabella Hurst, female    DOB: 1946-09-22, 68 y.o.   MRN: [id number]  HPI  68YO female presents for follow up.  Last seen 12/18 for left sciatic pain. Started on Prednisone taper. Pain symptoms improved within a few hours of starting medication. Has not taken any pain medication. Currently on Prednisone 5mg  daily for 3 days. Notes some new symptoms of aching pain left mid back. Worsens with movement. Rated 3/10 intensity. Not taking anything for this.  Past medical, surgical, family and social history per today's encounter.  Review of Systems  Constitutional: Negative for fever, chills, appetite change, fatigue and unexpected weight change.  Eyes: Negative for visual disturbance.  Respiratory: Negative for shortness of breath.   Cardiovascular: Negative for chest pain and leg swelling.  Gastrointestinal: Negative for nausea, vomiting, abdominal pain, diarrhea and constipation.  Musculoskeletal: Positive for myalgias, back pain and arthralgias.  Skin: Negative for color change and rash.  Hematological: Negative for adenopathy. Does not bruise/bleed easily.  Psychiatric/Behavioral: Negative for dysphoric mood. The patient is not nervous/anxious.        Objective:    BP 126/79 mmHg  Pulse 62  Temp(Src) 98.4 F (36.9 C) (Oral)  Ht 5' 5.5" (1.664 m)  Wt 213 lb 4 oz (96.73 kg)  BMI 34.93 kg/m2  SpO2 98% Physical Exam  Constitutional: She is oriented to person, place, and time. She appears well-developed and well-nourished. No distress.  HENT:  Head: Normocephalic and atraumatic.  Right Ear: External ear normal.  Left Ear: External ear normal.  Nose: Nose normal.  Mouth/Throat: Oropharynx is clear and moist.  Eyes: Conjunctivae are normal. Pupils are equal, round, and reactive to light. Right eye exhibits no discharge. Left eye exhibits no discharge. No scleral icterus.  Neck: Normal range of motion. Neck supple. No tracheal deviation present. No  thyromegaly present.  Cardiovascular: Normal rate, regular rhythm, normal heart sounds and intact distal pulses.  Exam reveals no gallop and no friction rub.   No murmur heard. Pulmonary/Chest: Effort normal and breath sounds normal. No accessory muscle usage. No tachypnea. No respiratory distress. She has no decreased breath sounds. She has no wheezes. She has no rhonchi. She has no rales. She exhibits no tenderness.  Musculoskeletal: Normal range of motion. She exhibits no edema.       Lumbar back: She exhibits tenderness and pain. She exhibits normal range of motion and no bony tenderness.       Back:  Lymphadenopathy:    She has no cervical adenopathy.  Neurological: She is alert and oriented to person, place, and time. No cranial nerve deficit. She exhibits normal muscle tone. Coordination normal.  Skin: Skin is warm and dry. No rash noted. She is not diaphoretic. No erythema. No pallor.  Psychiatric: She has a normal mood and affect. Her behavior is normal. Judgment and thought content normal.          Assessment & Plan:   Problem List Items Addressed This Visit      Unprioritized   Left sciatic nerve pain - Primary    Left sciatic pain improving with some new mid back pain. Suspect muscular strain. Recommended completing prednisone course. Use Valium prn for muscle pain/spasm. Follow up as scheduled with Dr. Sharlet Salina in 09/2014 and here in 10/2014. She will call or RTC earlier if symptoms worsening or if new symptoms develop.        Return in about 6 weeks (around 11/02/2014) for Recheck.

## 2014-09-21 NOTE — Progress Notes (Signed)
Pre visit review using our clinic review tool, if applicable. No additional management support is needed unless otherwise documented below in the visit note. 

## 2014-09-21 NOTE — Assessment & Plan Note (Signed)
Left sciatic pain improving with some new mid back pain. Suspect muscular strain. Recommended completing prednisone course. Use Valium prn for muscle pain/spasm. Follow up as scheduled with Dr. Sharlet Salina in 09/2014 and here in 10/2014. She will call or RTC earlier if symptoms worsening or if new symptoms develop.

## 2014-09-21 NOTE — Patient Instructions (Signed)
Follow up with Dr. Sharlet Salina as scheduled.  Follow up here in 6 weeks or sooner as needed.

## 2014-10-05 ENCOUNTER — Ambulatory Visit: Payer: Self-pay | Admitting: Gastroenterology

## 2014-10-05 DIAGNOSIS — K648 Other hemorrhoids: Secondary | ICD-10-CM | POA: Diagnosis not present

## 2014-10-05 DIAGNOSIS — Z86010 Personal history of colonic polyps: Secondary | ICD-10-CM | POA: Diagnosis not present

## 2014-10-05 DIAGNOSIS — K5730 Diverticulosis of large intestine without perforation or abscess without bleeding: Secondary | ICD-10-CM | POA: Diagnosis not present

## 2014-10-05 DIAGNOSIS — Z888 Allergy status to other drugs, medicaments and biological substances status: Secondary | ICD-10-CM | POA: Diagnosis not present

## 2014-10-05 DIAGNOSIS — D123 Benign neoplasm of transverse colon: Secondary | ICD-10-CM | POA: Diagnosis not present

## 2014-10-05 DIAGNOSIS — I10 Essential (primary) hypertension: Secondary | ICD-10-CM | POA: Diagnosis not present

## 2014-10-05 DIAGNOSIS — K621 Rectal polyp: Secondary | ICD-10-CM | POA: Diagnosis not present

## 2014-10-05 DIAGNOSIS — Z1211 Encounter for screening for malignant neoplasm of colon: Secondary | ICD-10-CM | POA: Diagnosis not present

## 2014-10-05 DIAGNOSIS — D122 Benign neoplasm of ascending colon: Secondary | ICD-10-CM | POA: Diagnosis not present

## 2014-10-05 DIAGNOSIS — D128 Benign neoplasm of rectum: Secondary | ICD-10-CM | POA: Diagnosis not present

## 2014-10-05 LAB — HM COLONOSCOPY

## 2014-10-13 DIAGNOSIS — F3342 Major depressive disorder, recurrent, in full remission: Secondary | ICD-10-CM | POA: Diagnosis not present

## 2014-10-13 DIAGNOSIS — F42 Obsessive-compulsive disorder: Secondary | ICD-10-CM | POA: Diagnosis not present

## 2014-10-16 ENCOUNTER — Telehealth: Payer: Self-pay | Admitting: *Deleted

## 2014-10-16 NOTE — Telephone Encounter (Signed)
Pt called states she has left side low back pain with urinary burning which started on Friday.  Pt states she has had the low back pain since her last OV with you.  Please advise

## 2014-10-16 NOTE — Telephone Encounter (Signed)
Transferred pt to [place] in scheduling for appoint

## 2014-10-16 NOTE — Telephone Encounter (Signed)
Needs to be seen and evaluated. 

## 2014-10-17 ENCOUNTER — Encounter: Payer: Self-pay | Admitting: Internal Medicine

## 2014-10-17 ENCOUNTER — Ambulatory Visit: Payer: Medicare Other | Admitting: Nurse Practitioner

## 2014-10-17 ENCOUNTER — Ambulatory Visit (INDEPENDENT_AMBULATORY_CARE_PROVIDER_SITE_OTHER): Payer: Medicare Other | Admitting: Internal Medicine

## 2014-10-17 VITALS — BP 135/77 | HR 68 | Temp 98.1°F | Ht 65.5 in | Wt 210.2 lb

## 2014-10-17 DIAGNOSIS — R300 Dysuria: Secondary | ICD-10-CM | POA: Diagnosis not present

## 2014-10-17 LAB — URINALYSIS, MICROSCOPIC ONLY: RBC / HPF: NONE SEEN (ref 0–?)

## 2014-10-17 LAB — POCT URINALYSIS DIPSTICK
Bilirubin, UA: NEGATIVE
Blood, UA: NEGATIVE
GLUCOSE UA: NEGATIVE
KETONES UA: NEGATIVE
NITRITE UA: NEGATIVE
PROTEIN UA: NEGATIVE
Spec Grav, UA: 1.02
Urobilinogen, UA: 0.2
pH, UA: 5.5

## 2014-10-17 MED ORDER — CIPROFLOXACIN HCL 500 MG PO TABS: 500.0000 mg | ORAL_TABLET | Freq: Two times a day (BID) | ORAL | Status: DC

## 2014-10-17 NOTE — Progress Notes (Signed)
Pre visit review using our clinic review tool, if applicable. No additional management support is needed unless otherwise documented below in the visit note. 

## 2014-10-17 NOTE — Assessment & Plan Note (Signed)
Recent dysuria. UA pos for leuk only. Will send formal UA and culture. Start empiric Cipro. Will call with culture results. She will also follow up with back pain specialist for chronic sciatic pain issues next week. Follow up here immediately if any worsening symptoms.

## 2014-10-17 NOTE — Progress Notes (Signed)
   Subjective:    Patient ID: Isabella Hurst, female    DOB: [birth year], 69 y.o.   MRN: [id number]  HPI  69YO female presents for acute visit.  Dysuria - Friday developed burning with urination. Increased fluid intake. Having left flank pain, however has chronic sciatic pain in this same area. Worsened with movement at times.  No fever or chills. No blood in urine. No urinary urgency or frequency.  Past medical, surgical, family and social history per today's encounter.  Review of Systems  Constitutional: Negative for fever, chills, appetite change, fatigue and unexpected weight change.  Eyes: Negative for visual disturbance.  Respiratory: Negative for shortness of breath.   Cardiovascular: Negative for chest pain and leg swelling.  Gastrointestinal: Negative for abdominal pain.  Genitourinary: Positive for dysuria and flank pain (left ). Negative for urgency, frequency and pelvic pain.  Skin: Negative for color change and rash.  Hematological: Negative for adenopathy. Does not bruise/bleed easily.  Psychiatric/Behavioral: Negative for dysphoric mood. The patient is not nervous/anxious.        Objective:    BP 135/77 mmHg  Pulse 68  Temp(Src) 98.1 F (36.7 C) (Oral)  Ht 5' 5.5" (1.664 m)  Wt 210 lb 4 oz (95.369 kg)  BMI 34.44 kg/m2  SpO2 97% Physical Exam  Constitutional: She is oriented to person, place, and time. She appears well-developed and well-nourished. No distress.  HENT:  Head: Normocephalic and atraumatic.  Right Ear: External ear normal.  Left Ear: External ear normal.  Nose: Nose normal.  Mouth/Throat: Oropharynx is clear and moist. No oropharyngeal exudate.  Eyes: Conjunctivae are normal. Pupils are equal, round, and reactive to light. Right eye exhibits no discharge. Left eye exhibits no discharge. No scleral icterus.  Neck: Normal range of motion. Neck supple. No tracheal deviation present. No thyromegaly present.  Cardiovascular: Normal rate, regular  rhythm, normal heart sounds and intact distal pulses.  Exam reveals no gallop and no friction rub.   No murmur heard. Pulmonary/Chest: Effort normal and breath sounds normal. No accessory muscle usage. No tachypnea. No respiratory distress. She has no decreased breath sounds. She has no wheezes. She has no rhonchi. She has no rales. She exhibits no tenderness.  Abdominal: There is no tenderness (no CVA tenderness).  Musculoskeletal: Normal range of motion. She exhibits no edema or tenderness.  Lymphadenopathy:    She has no cervical adenopathy.  Neurological: She is alert and oriented to person, place, and time. No cranial nerve deficit. She exhibits normal muscle tone. Coordination normal.  Skin: Skin is warm and dry. No rash noted. She is not diaphoretic. No erythema. No pallor.  Psychiatric: She has a normal mood and affect. Her behavior is normal. Judgment and thought content normal.          Assessment & Plan:   Problem List Items Addressed This Visit      Unprioritized   Dysuria - Primary    Recent dysuria. UA pos for leuk only. Will send formal UA and culture. Start empiric Cipro. Will call with culture results. She will also follow up with back pain specialist for chronic sciatic pain issues next week. Follow up here immediately if any worsening symptoms.      Relevant Medications   ciprofloxacin (CIPRO) tablet   Other Relevant Orders   POCT Urinalysis Dipstick (Completed)   CULTURE, URINE COMPREHENSIVE   Urine Microscopic Only       Return if symptoms worsen or fail to improve.

## 2014-10-17 NOTE — Patient Instructions (Signed)
We will send urine culture today.  Start Cipro 500mg  twice daily.  Call immediately if worsening pain, fever, chills, or other concerns.

## 2014-10-23 DIAGNOSIS — M5416 Radiculopathy, lumbar region: Secondary | ICD-10-CM | POA: Diagnosis not present

## 2014-10-23 DIAGNOSIS — M5136 Other intervertebral disc degeneration, lumbar region: Secondary | ICD-10-CM | POA: Diagnosis not present

## 2014-10-23 DIAGNOSIS — H25013 Cortical age-related cataract, bilateral: Secondary | ICD-10-CM | POA: Diagnosis not present

## 2014-10-23 DIAGNOSIS — H52223 Regular astigmatism, bilateral: Secondary | ICD-10-CM | POA: Diagnosis not present

## 2014-10-23 DIAGNOSIS — M5116 Intervertebral disc disorders with radiculopathy, lumbar region: Secondary | ICD-10-CM | POA: Insufficient documentation

## 2014-10-23 DIAGNOSIS — H2513 Age-related nuclear cataract, bilateral: Secondary | ICD-10-CM | POA: Diagnosis not present

## 2014-10-23 DIAGNOSIS — H5203 Hypermetropia, bilateral: Secondary | ICD-10-CM | POA: Diagnosis not present

## 2014-10-26 ENCOUNTER — Telehealth: Payer: Self-pay | Admitting: Internal Medicine

## 2014-10-26 NOTE — Telephone Encounter (Signed)
The patient wanting result of her urine culture. She is still having left flank pain.

## 2014-10-26 NOTE — Telephone Encounter (Signed)
Urine culture result is not yet back. Can you let pt know and check with lab to see where culture is?

## 2014-10-26 NOTE — Telephone Encounter (Signed)
Isabella Hurst, can you please check on the status of these results   Advised Pt that we will be checking with the lab on the culture results.

## 2014-10-26 NOTE — Telephone Encounter (Signed)
Check it was sent to our lab but wasn't sent to [facility]

## 2014-11-02 ENCOUNTER — Ambulatory Visit: Payer: Self-pay | Admitting: Physical Medicine and Rehabilitation

## 2014-11-02 ENCOUNTER — Ambulatory Visit: Payer: Medicare Other | Admitting: Internal Medicine

## 2014-11-02 DIAGNOSIS — G544 Lumbosacral root disorders, not elsewhere classified: Secondary | ICD-10-CM | POA: Diagnosis not present

## 2014-11-02 DIAGNOSIS — M5186 Other intervertebral disc disorders, lumbar region: Secondary | ICD-10-CM | POA: Diagnosis not present

## 2014-11-02 DIAGNOSIS — M5386 Other specified dorsopathies, lumbar region: Secondary | ICD-10-CM | POA: Diagnosis not present

## 2014-11-02 DIAGNOSIS — M5127 Other intervertebral disc displacement, lumbosacral region: Secondary | ICD-10-CM | POA: Diagnosis not present

## 2014-11-02 DIAGNOSIS — M5126 Other intervertebral disc displacement, lumbar region: Secondary | ICD-10-CM | POA: Diagnosis not present

## 2014-11-02 DIAGNOSIS — M4806 Spinal stenosis, lumbar region: Secondary | ICD-10-CM | POA: Diagnosis not present

## 2014-11-09 ENCOUNTER — Other Ambulatory Visit: Payer: Self-pay | Admitting: Internal Medicine

## 2014-11-13 DIAGNOSIS — M5416 Radiculopathy, lumbar region: Secondary | ICD-10-CM | POA: Diagnosis not present

## 2014-11-13 DIAGNOSIS — M5136 Other intervertebral disc degeneration, lumbar region: Secondary | ICD-10-CM | POA: Diagnosis not present

## 2014-11-16 ENCOUNTER — Ambulatory Visit (INDEPENDENT_AMBULATORY_CARE_PROVIDER_SITE_OTHER): Payer: Medicare Other | Admitting: Internal Medicine

## 2014-11-16 ENCOUNTER — Encounter: Payer: Self-pay | Admitting: Internal Medicine

## 2014-11-16 VITALS — BP 131/80 | HR 70 | Temp 98.6°F | Ht 65.5 in | Wt 208.1 lb

## 2014-11-16 DIAGNOSIS — N390 Urinary tract infection, site not specified: Secondary | ICD-10-CM

## 2014-11-16 DIAGNOSIS — M10072 Idiopathic gout, left ankle and foot: Secondary | ICD-10-CM | POA: Diagnosis not present

## 2014-11-16 DIAGNOSIS — Z139 Encounter for screening, unspecified: Secondary | ICD-10-CM | POA: Diagnosis not present

## 2014-11-16 DIAGNOSIS — M109 Gout, unspecified: Secondary | ICD-10-CM | POA: Insufficient documentation

## 2014-11-16 DIAGNOSIS — E559 Vitamin D deficiency, unspecified: Secondary | ICD-10-CM

## 2014-11-16 DIAGNOSIS — M5116 Intervertebral disc disorders with radiculopathy, lumbar region: Secondary | ICD-10-CM | POA: Diagnosis not present

## 2014-11-16 LAB — POCT URINALYSIS DIPSTICK
Glucose, UA: NEGATIVE
Ketones, UA: 15
Nitrite, UA: NEGATIVE
Protein, UA: 30
RBC UA: NEGATIVE
SPEC GRAV UA: 1.02
Urobilinogen, UA: 1
pH, UA: 5

## 2014-11-16 LAB — VITAMIN D 25 HYDROXY (VIT D DEFICIENCY, FRACTURES): VITD: 45.55 ng/mL (ref 30.00–100.00)

## 2014-11-16 MED ORDER — COLCHICINE 0.6 MG PO TABS: 0.6000 mg | ORAL_TABLET | Freq: Two times a day (BID) | ORAL | Status: DC

## 2014-11-16 NOTE — Progress Notes (Signed)
Pre visit review using our clinic review tool, if applicable. No additional management support is needed unless otherwise documented below in the visit note. 

## 2014-11-16 NOTE — Progress Notes (Signed)
Subjective:    Patient ID: Isabella Hurst, female    DOB: 01/31/1946, 68 y.o.   MRN: [id number]  HPI  69YO female presents for follow up.  Gout - Having acute attack over last few days in left foot. Unable to walk without limping. In past, had prednisone injection with Dr. Milinda Pointer with resolution of symptoms.  However, reluctant to use steroids, as scheduled for [facility] on March 1st with Dr. Sharlet Salina.  Continues to have left flank pain. MRI lumbar spine showed DJD and disc herniation at L5. Taking Aleve, Tizanidine, Tramadol and Neurontin with improvement, but some persistent sharp or aching pain. Pain does not generally radiate. No weakness, numbness at present.  Past medical, surgical, family and social history per today's encounter.  Review of Systems  Constitutional: Negative for fever, chills, appetite change, fatigue and unexpected weight change.  Eyes: Negative for visual disturbance.  Respiratory: Negative for shortness of breath.   Cardiovascular: Negative for chest pain and leg swelling.  Gastrointestinal: Negative for abdominal pain.  Musculoskeletal: Positive for myalgias and arthralgias.  Skin: Negative for color change and rash.  Neurological: Negative for weakness and numbness.  Hematological: Negative for adenopathy. Does not bruise/bleed easily.  Psychiatric/Behavioral: Negative for dysphoric mood. The patient is not nervous/anxious.        Objective:    BP 131/80 mmHg  Pulse 70  Temp(Src) 98.6 F (37 C) (Oral)  Ht 5' 5.5" (1.664 m)  Wt 208 lb 2 oz (94.405 kg)  BMI 34.09 kg/m2  SpO2 98% Physical Exam  Constitutional: She is oriented to person, place, and time. She appears well-developed and well-nourished. No distress.  HENT:  Head: Normocephalic and atraumatic.  Right Ear: External ear normal.  Left Ear: External ear normal.  Nose: Nose normal.  Mouth/Throat: Oropharynx is clear and moist. No oropharyngeal exudate.  Eyes: Conjunctivae are normal. Pupils  are equal, round, and reactive to light. Right eye exhibits no discharge. Left eye exhibits no discharge. No scleral icterus.  Neck: Normal range of motion. Neck supple. No tracheal deviation present. No thyromegaly present.  Cardiovascular: Normal rate, regular rhythm, normal heart sounds and intact distal pulses.  Exam reveals no gallop and no friction rub.   No murmur heard. Pulmonary/Chest: Effort normal and breath sounds normal. No respiratory distress. She has no wheezes. She has no rales. She exhibits no tenderness.  Musculoskeletal: Normal range of motion. She exhibits no edema.       Lumbar back: She exhibits tenderness and pain. She exhibits normal range of motion.       Left foot: There is tenderness.       Feet:  Lymphadenopathy:    She has no cervical adenopathy.  Neurological: She is alert and oriented to person, place, and time. No cranial nerve deficit. She exhibits normal muscle tone. Coordination normal.  Skin: Skin is warm and dry. No rash noted. She is not diaphoretic. No erythema. No pallor.  Psychiatric: She has a normal mood and affect. Her behavior is normal. Judgment and thought content normal.          Assessment & Plan:   Problem List Items Addressed This Visit      Unprioritized   Acute gout - Primary    Acute gout flare. Will start Colchicine. Try to avoid steroids given upcoming ESI. Follow up if symptoms are not improving.      Relevant Medications   colchicine tablet 0.6 mg   Neuritis or radiculitis due to rupture of lumbar  intervertebral disc    Reviewed notes from Dr. Sharlet Salina. Reviewed MRI. Plan for ESI. Discussed pros and cons of this with pt. Encouraged her to proceed with hopes of improved pain control. Follow up after procedure. Continue Aleve, Tizanidine, Tramadol, and Neurontin.      Relevant Medications   tiZANidine (ZANAFLEX) 4 MG tablet   gabapentin (NEURONTIN) 300 MG capsule   traZODone (DESYREL) 100 MG tablet    Other Visit  Diagnoses    Screening        Urinary tract infection without hematuria, site unspecified        Relevant Orders    POCT urinalysis dipstick (Completed)    Urine culture    Vitamin D deficiency        Relevant Orders    Vit D  25 hydroxy (rtn osteoporosis monitoring)        Return in about 3 months (around 02/14/2015).

## 2014-11-16 NOTE — Assessment & Plan Note (Signed)
Acute gout flare. Will start Colchicine. Try to avoid steroids given upcoming ESI. Follow up if symptoms are not improving.

## 2014-11-16 NOTE — Patient Instructions (Signed)
Start Colcrys, taking 2 tablets (1.2mg ) when you get it, then 1 tablet (0.6mg ) before bed. Then, continue twice daily until flare complete.

## 2014-11-16 NOTE — Assessment & Plan Note (Signed)
Reviewed notes from Dr. Sharlet Salina. Reviewed MRI. Plan for ESI. Discussed pros and cons of this with pt. Encouraged her to proceed with hopes of improved pain control. Follow up after procedure. Continue Aleve, Tizanidine, Tramadol, and Neurontin.

## 2014-11-18 LAB — URINE CULTURE: Colony Count: 8000

## 2014-11-21 ENCOUNTER — Encounter: Payer: Self-pay | Admitting: *Deleted

## 2014-11-22 ENCOUNTER — Telehealth: Payer: Self-pay | Admitting: *Deleted

## 2014-11-22 NOTE — Telephone Encounter (Signed)
Spoke with pt, she states she has only had an MRI ordered and performed by Dr Sharlet Salina, which was for her low back pain.

## 2014-11-22 NOTE — Telephone Encounter (Signed)
Pt called states pain in the left side of her back is increasingly worse.  Please advise

## 2014-11-22 NOTE — Telephone Encounter (Signed)
Has she had the ESI with Dr. Sharlet Salina? She may need to follow up with him if she recently had this procedure

## 2014-11-23 ENCOUNTER — Ambulatory Visit: Payer: Self-pay | Admitting: Internal Medicine

## 2014-11-23 ENCOUNTER — Encounter: Payer: Self-pay | Admitting: Internal Medicine

## 2014-11-23 ENCOUNTER — Telehealth: Payer: Self-pay | Admitting: Internal Medicine

## 2014-11-23 ENCOUNTER — Ambulatory Visit (INDEPENDENT_AMBULATORY_CARE_PROVIDER_SITE_OTHER): Payer: Medicare Other | Admitting: Internal Medicine

## 2014-11-23 VITALS — BP 136/82 | HR 63 | Temp 98.5°F | Ht 65.5 in | Wt 208.0 lb

## 2014-11-23 DIAGNOSIS — K868 Other specified diseases of pancreas: Secondary | ICD-10-CM | POA: Diagnosis not present

## 2014-11-23 DIAGNOSIS — N390 Urinary tract infection, site not specified: Secondary | ICD-10-CM | POA: Diagnosis not present

## 2014-11-23 DIAGNOSIS — R319 Hematuria, unspecified: Secondary | ICD-10-CM | POA: Diagnosis not present

## 2014-11-23 DIAGNOSIS — K8020 Calculus of gallbladder without cholecystitis without obstruction: Secondary | ICD-10-CM | POA: Diagnosis not present

## 2014-11-23 DIAGNOSIS — R109 Unspecified abdominal pain: Secondary | ICD-10-CM | POA: Diagnosis not present

## 2014-11-23 DIAGNOSIS — K429 Umbilical hernia without obstruction or gangrene: Secondary | ICD-10-CM | POA: Diagnosis not present

## 2014-11-23 LAB — POCT URINALYSIS DIPSTICK
BILIRUBIN UA: NEGATIVE
GLUCOSE UA: NEGATIVE
Ketones, UA: NEGATIVE
Protein, UA: NEGATIVE
SPEC GRAV UA: 1.01
UROBILINOGEN UA: 0.2
pH, UA: 6.5

## 2014-11-23 MED ORDER — KETOROLAC TROMETHAMINE 30 MG/ML IJ SOLN
30.0000 mg | Freq: Once | INTRAMUSCULAR | Status: AC
  Administered 2014-11-23: 30 mg via INTRAMUSCULAR

## 2014-11-23 NOTE — Assessment & Plan Note (Signed)
Severe left flank pain. UA pos for blood. Will set up STAT CT abd/pel to look for nephrolithiasis. Toradol 30mg  IM given in clinic today. Pt will continue Tramadol to help with pain control at home. Follow up tomorrow.

## 2014-11-23 NOTE — Telephone Encounter (Signed)
Per pt, the Steroid injection is next tues but that is for the herniated disk. The pain she is complaining about is left back/flank area. Moved pt's appt to your schedule today.

## 2014-11-23 NOTE — Progress Notes (Addendum)
Subjective:    Patient ID: Isabella Hurst, female    DOB: January 24, 1946, 69 y.o.   MRN: [id number]  HPI  69YO female presents for acute visit.  Left flank pain- Severe over last few days. Described as sharp. Comes and goes. Made worse by movement or taking a deep breath. Denies chest pain or dyspnea. No recent fever, chills, cough. No dysuria. No gross hematuria.  Past medical, surgical, family and social history per today's encounter.  Review of Systems  Constitutional: Negative for fever, chills, appetite change, fatigue and unexpected weight change.  Eyes: Negative for visual disturbance.  Respiratory: Negative for shortness of breath.   Cardiovascular: Negative for chest pain and leg swelling.  Gastrointestinal: Negative for nausea, vomiting, abdominal pain, diarrhea and constipation.  Genitourinary: Positive for flank pain. Negative for dysuria, urgency, frequency, hematuria and difficulty urinating.  Musculoskeletal: Positive for myalgias, back pain and arthralgias.  Skin: Negative for color change and rash.  Hematological: Negative for adenopathy. Does not bruise/bleed easily.  Psychiatric/Behavioral: Negative for dysphoric mood. The patient is not nervous/anxious.        Objective:    BP 136/82 mmHg  Pulse 63  Temp(Src) 98.5 F (36.9 C) (Oral)  Ht 5' 5.5" (1.664 m)  Wt 208 lb (94.348 kg)  BMI 34.07 kg/m2  SpO2 98% Physical Exam  Constitutional: She is oriented to person, place, and time. She appears well-developed and well-nourished. No distress.  HENT:  Head: Normocephalic and atraumatic.  Right Ear: External ear normal.  Left Ear: External ear normal.  Nose: Nose normal.  Mouth/Throat: Oropharynx is clear and moist. No oropharyngeal exudate.  Eyes: Conjunctivae are normal. Pupils are equal, round, and reactive to light. Right eye exhibits no discharge. Left eye exhibits no discharge. No scleral icterus.  Neck: Normal range of motion. Neck supple. No tracheal  deviation present. No thyromegaly present.  Cardiovascular: Normal rate, regular rhythm, normal heart sounds and intact distal pulses.  Exam reveals no gallop and no friction rub.   No murmur heard. Pulmonary/Chest: Effort normal and breath sounds normal. No respiratory distress. She has no wheezes. She has no rales. She exhibits no tenderness.  Musculoskeletal: Normal range of motion. She exhibits no edema or tenderness.       Right shoulder: She exhibits pain.       Arms: Lymphadenopathy:    She has no cervical adenopathy.  Neurological: She is alert and oriented to person, place, and time. No cranial nerve deficit. She exhibits normal muscle tone. Coordination normal.  Skin: Skin is warm and dry. No rash noted. She is not diaphoretic. No erythema. No pallor.  Psychiatric: She has a normal mood and affect. Her behavior is normal. Judgment and thought content normal.          Assessment & Plan:   Problem List Items Addressed This Visit      Unprioritized   Left flank pain - Primary    Severe left flank pain. UA pos for blood. Will set up STAT CT abd/pel to look for nephrolithiasis. Toradol 30mg  IM given in clinic today. Pt will continue Tramadol to help with pain control at home. Follow up tomorrow.      Relevant Medications   ketorolac (TORADOL) 30 MG/ML injection 30 mg (Completed)   Other Relevant Orders   CT Abdomen Pelvis Wo Contrast    Other Visit Diagnoses    Urinary tract infection with hematuria, site unspecified        Relevant Orders  POCT urinalysis dipstick (Completed)    Urine culture        Return in about 1 day (around 11/24/2014) for Recheck.

## 2014-11-23 NOTE — Patient Instructions (Signed)
CT of the abdomen today.  Toradol injection today to help with pain.  Start Tramadol 50mg -100mg  up to every 8 hours for pain.

## 2014-11-23 NOTE — Telephone Encounter (Signed)
Isabella Hurst, Can you talk with her? She was supposed to be scheduled for steroid injection with Dr. Sharlet Salina. Was this canceled? If we need to, we can see her tomorrow afternoon.

## 2014-11-23 NOTE — Progress Notes (Signed)
Pre visit review using our clinic review tool, if applicable. No additional management support is needed unless otherwise documented below in the visit note. 

## 2014-11-23 NOTE — Telephone Encounter (Signed)
Isabella Hurst, Did we find out what happened with Dr. Sharlet Salina? We can move Isabella Hurst to 3:30 for me

## 2014-11-23 NOTE — Telephone Encounter (Signed)
Patient Name: Isabella Hurst DOB: [birth year] Initial Comment Caller states spoke to a nurse about back pain yesterday, no one got back to her about what Dr walker wants to do Nurse Assessment Nurse: Marcelline Deist, RN, Kermit Balo Date/Time (Eastern Time): 11/23/2014 12:26:35 PM Confirm and document reason for call. If symptomatic, describe symptoms. ---Caller states spoke to a nurse about back pain yesterday, no one got back to her about what Dr Gilford Rile wants to do. Has left sided flank pain that she has had since December, worse than it has been. Wondering if it could be a kidney stone. Has had back pain issues. Scheduled for a low back lumbar injection next week. Has Tramadol & muscle relaxers, but not sure whether to take these rxs or not. Has the patient traveled out of the country within the last 30 days? ---Not Applicable Does the patient require triage? ---Yes Related visit to physician within the last 2 weeks? ---No Does the PT have any chronic conditions? (i.e. diabetes, asthma, etc.) ---Yes List chronic conditions. ---BP rx, low back issues Guidelines Guideline Title Affirmed Question Affirmed Notes Flank Pain MODERATE pain (e.g., interferes with normal activities or awakens from sleep) Final Disposition User See Physician within [place], RN, Doctor Phillips just really wants to know how to proceed, whether taking Tramadol will mask what is going on with the flank pain, why is it worsening. Scheduled to see NP today & advised to take lowest amt. of OTC pain rx until seen.

## 2014-11-24 ENCOUNTER — Ambulatory Visit (INDEPENDENT_AMBULATORY_CARE_PROVIDER_SITE_OTHER): Payer: Medicare Other | Admitting: Internal Medicine

## 2014-11-24 ENCOUNTER — Encounter: Payer: Self-pay | Admitting: Internal Medicine

## 2014-11-24 VITALS — BP 117/75 | HR 55 | Temp 98.1°F | Ht 65.5 in | Wt 207.5 lb

## 2014-11-24 DIAGNOSIS — R109 Unspecified abdominal pain: Secondary | ICD-10-CM

## 2014-11-24 MED ORDER — TRAMADOL HCL 50 MG PO TABS: 50.0000 mg | ORAL_TABLET | Freq: Three times a day (TID) | ORAL | Status: DC | PRN

## 2014-11-24 NOTE — Patient Instructions (Signed)
Continue Tramadol 50-100mg  up to every 8 hours as needed for pain.  We will set up an MRI of the thoracic spine to further evaluate back pain.  Follow up in 2 weeks.

## 2014-11-24 NOTE — Progress Notes (Signed)
   Subjective:    Patient ID: Isabella Hurst, female    DOB: [birth year], 69 y.o.   MRN: [id number]  HPI 69YO female presents for follow up.  Last seen yesterday to evaluate severe left flank pain. CT showed no acute process, no nephrolithiasis. Given Tramadol and Toradol IM x 1 for pain.  Pain has improved. Taking Tramadol tid. No fever, chills. No dyspnea or other symptoms.   Past medical, surgical, family and social history per today's encounter.  Review of Systems  Constitutional: Negative for fever, chills, appetite change, fatigue and unexpected weight change.  Eyes: Negative for visual disturbance.  Respiratory: Negative for shortness of breath.   Cardiovascular: Negative for chest pain and leg swelling.  Gastrointestinal: Negative for abdominal pain.  Genitourinary: Negative for dysuria, urgency, frequency and hematuria.  Musculoskeletal: Positive for myalgias and arthralgias.  Skin: Negative for color change and rash.  Hematological: Negative for adenopathy. Does not bruise/bleed easily.  Psychiatric/Behavioral: Negative for dysphoric mood. The patient is not nervous/anxious.        Objective:    BP 117/75 mmHg  Pulse 55  Temp(Src) 98.1 F (36.7 C) (Oral)  Ht 5' 5.5" (1.664 m)  Wt 207 lb 8 oz (94.121 kg)  BMI 33.99 kg/m2  SpO2 99% Physical Exam  Constitutional: She is oriented to person, place, and time. She appears well-developed and well-nourished. No distress.  HENT:  Head: Normocephalic and atraumatic.  Right Ear: External ear normal.  Left Ear: External ear normal.  Nose: Nose normal.  Mouth/Throat: Oropharynx is clear and moist. No oropharyngeal exudate.  Eyes: Conjunctivae are normal. Pupils are equal, round, and reactive to light. Right eye exhibits no discharge. Left eye exhibits no discharge. No scleral icterus.  Neck: Normal range of motion. Neck supple. No tracheal deviation present. No thyromegaly present.  Cardiovascular: Normal rate, regular  rhythm, normal heart sounds and intact distal pulses.  Exam reveals no gallop and no friction rub.   No murmur heard. Pulmonary/Chest: Effort normal and breath sounds normal. No respiratory distress. She has no wheezes. She has no rales. She exhibits no tenderness.  Musculoskeletal: She exhibits no edema.       Lumbar back: She exhibits decreased range of motion, tenderness and pain.       Back:  Lymphadenopathy:    She has no cervical adenopathy.  Neurological: She is alert and oriented to person, place, and time. No cranial nerve deficit. She exhibits normal muscle tone. Coordination normal.  Skin: Skin is warm and dry. No rash noted. She is not diaphoretic. No erythema. No pallor.  Psychiatric: She has a normal mood and affect. Her behavior is normal. Judgment and thought content normal.          Assessment & Plan:   Problem List Items Addressed This Visit      Unprioritized   Left flank pain - Primary    Symptoms of severe left flank pain. CT showed no acute abnormalities. Pain slightly improved with Toradol and prn Tramadol. Suspect thoracic DJD with radiculopathy. Will get MRI thoracic spine for further evaluation. Continue prn Tramadol. Follow up here in 2 weeks.      Relevant Medications   traMADol (ULTRAM) 50 MG tablet   Other Relevant Orders   MR Thoracic Spine Wo Contrast       Return in about 2 weeks (around 12/08/2014) for Recheck.

## 2014-11-24 NOTE — Assessment & Plan Note (Signed)
Symptoms of severe left flank pain. CT showed no acute abnormalities. Pain slightly improved with Toradol and prn Tramadol. Suspect thoracic DJD with radiculopathy. Will get MRI thoracic spine for further evaluation. Continue prn Tramadol. Follow up here in 2 weeks.

## 2014-11-25 LAB — URINE CULTURE

## 2014-11-27 ENCOUNTER — Telehealth: Payer: Self-pay | Admitting: *Deleted

## 2014-11-27 NOTE — Telephone Encounter (Signed)
Pt called stating that she wants to hold off on the MRI as of now, she feels better with the use of muscle relaxer and and ibuprofen.  She will follow up with you at her next appt.

## 2014-11-29 ENCOUNTER — Ambulatory Visit: Payer: 59 | Admitting: Podiatry

## 2014-12-21 ENCOUNTER — Encounter: Payer: Self-pay | Admitting: Internal Medicine

## 2015-01-03 ENCOUNTER — Ambulatory Visit: Payer: Medicare Other | Admitting: Internal Medicine

## 2015-01-08 DIAGNOSIS — F3342 Major depressive disorder, recurrent, in full remission: Secondary | ICD-10-CM | POA: Diagnosis not present

## 2015-01-08 DIAGNOSIS — F42 Obsessive-compulsive disorder: Secondary | ICD-10-CM | POA: Diagnosis not present

## 2015-01-17 ENCOUNTER — Ambulatory Visit: Payer: Medicare Other

## 2015-01-17 VITALS — BP 128/72 | HR 66 | Temp 99.2°F | Resp 14 | Ht 65.5 in | Wt 209.0 lb

## 2015-01-17 DIAGNOSIS — Z0000 Encounter for general adult medical examination without abnormal findings: Secondary | ICD-10-CM

## 2015-01-17 NOTE — Patient Instructions (Addendum)
  Isabella Hurst , Thank you for taking time to come for your Medicare Wellness Visit. I appreciate your ongoing commitment to your health goals. Please review the following plan we discussed and let me know if I can assist you in the future.   These are the goals we discussed: Goals    . Prevent Falls       This is a list of the screening recommended for you and due dates:  Health Maintenance  Topic Date Due  . Flu Shot  04/30/2015  . Mammogram  08/17/2016  . Tetanus Vaccine  09/29/2018  . Colon Cancer Screening  10/05/2024  . DEXA scan (bone density measurement)  Completed  . Shingles Vaccine  Completed  . Pneumonia vaccines  Completed    Fall Prevention and Home Safety   Falls cause injuries and can affect all age groups. It is possible to prevent falls.  HOW TO PREVENT FALLS  Wear shoes with rubber soles that do not have an opening for your toes.  Keep the inside and outside of your house well lit.  Use night lights throughout your home.  Remove clutter from floors.  Clean up floor spills.  Remove throw rugs or fasten them to the floor with carpet tape.  Do not place electrical cords across pathways.  Put grab bars by your tub, shower, and toilet. Do not use towel bars as grab bars.  Put handrails on both sides of the stairway. Fix loose handrails.  Do not climb on stools or stepladders, if possible.  Do not wax your floors.  Repair uneven or unsafe sidewalks, walkways, or stairs.  Keep items you use a lot within reach.  Be aware of pets.  Keep emergency numbers next to the telephone.  Put smoke detectors in your home and near bedrooms. Ask your doctor what other things you can do to prevent falls. Document Released: 07/12/2009 Document Revised: 03/16/2012 Document Reviewed: 12/16/2011 [facility] Patient Information 2015 [place], [place]. This information is not intended to replace advice given to you by your health care provider. Make sure you discuss any  questions you have with your health care provider.

## 2015-01-17 NOTE — Progress Notes (Signed)
Agree with assessment and plan as outlined by [facility].

## 2015-01-17 NOTE — Progress Notes (Signed)
Subjective:   Isabella Hurst is a 69 y.o. female who presents for an Initial Medicare Annual Wellness Visit.  Review of Systems      Cardiac Risk Factors include: advanced age (>54men, >23 women)     Objective:    Today's Vitals   01/17/15 1317  BP: 128/72  Pulse: 66  Temp: 99.2 F (37.3 C)  TempSrc: Oral  Resp: 14  Height: 5' 5.5" (1.664 m)  Weight: 209 lb (94.802 kg)  SpO2: 96%    Current Medications (verified) Outpatient Encounter Prescriptions as of 01/17/2015  Medication Sig  . Calcium Carbonate-Vitamin D (CALCIUM 600+D) 600-400 MG-UNIT per tablet Take 1 tablet by mouth daily.  . Cholecalciferol (VITAMIN D3 HIGH POTENCY) 1000 UNITS capsule Take 1,000 Units by mouth daily.  . Cholecalciferol (VITAMIN D3) 1000 UNITS CAPS Take by mouth.  . Cyanocobalamin 1000 MCG TBCR Take by mouth.  . doxycycline (VIBRA-TABS) 100 MG tablet Take 1 tablet (100 mg total) by mouth 2 (two) times daily.  Marland Kitchen gabapentin (NEURONTIN) 300 MG capsule 1 po qHS x 4 days, then bid thereafter  . hydrochlorothiazide (HYDRODIURIL) 25 MG tablet TAKE 0.5 TABLETS (12.5 MG TOTAL) BY MOUTH DAILY.  Marland Kitchen lamoTRIgine (LAMICTAL) 150 MG tablet Take 1 tablet (150 mg total) by mouth daily.  . sertraline (ZOLOFT) 100 MG tablet Take 100 mg by mouth daily.  . traMADol (ULTRAM) 50 MG tablet Take 1-2 tablets (50-100 mg total) by mouth every 8 (eight) hours as needed.  . [DISCONTINUED] colchicine 0.6 MG tablet Take 1 tablet (0.6 mg total) by mouth 2 (two) times daily.  . [DISCONTINUED] diazepam (VALIUM) 5 MG tablet Take 1 tablet (5 mg total) by mouth every 8 (eight) hours as needed for anxiety.  . [DISCONTINUED] tiZANidine (ZANAFLEX) 4 MG tablet 1/2-1 po tid prn  . [DISCONTINUED] traZODone (DESYREL) 100 MG tablet Take 100 mg by mouth at bedtime.    Allergies (verified) Quinolones; Clindamycin/lincomycin; and Demerol   History: Past Medical History  Diagnosis Date  . Hypertension   . GERD (gastroesophageal reflux  disease)   . Colon polyps   . Depression     Dr. Lyndle Hurst from [place]  . Phlebitis    Past Surgical History  Procedure Laterality Date  . Cesarean section      2  . Gastric bypass  1980  . Hernia repair  1995    incisional hernia  . Abdominal hysterectomy  1994    complete for fibroids   Family History  Problem Relation Age of Onset  . Heart disease Mother   . Cancer Father     Lung cancer   Social History   Occupational History  . Not on file.   Social History Main Topics  . Smoking status: Never Smoker   . Smokeless tobacco: Never Used  . Alcohol Use: No  . Drug Use: No  . Sexual Activity: Not on file    Tobacco Counseling Counseling given: Not Answered   Activities of Daily Living In your present state of health, do you have any difficulty performing the following activities: 01/17/2015  Hearing? N  Vision? N  Difficulty concentrating or making decisions? N  Walking or climbing stairs? Y  Dressing or bathing? N  Doing errands, shopping? N  Preparing Food and eating ? N  Using the Toilet? N  In the past six months, have you accidently leaked urine? N  Do you have problems with loss of bowel control? N  Managing your Medications? N  Managing your Finances? N  Housekeeping or managing your Housekeeping? N    Immunizations and Health Maintenance Immunization History  Administered Date(s) Administered  . Influenza,inj,Quad PF,36+ Mos 06/09/2013  . Influenza-Unspecified 06/20/2014  . Pneumococcal Conjugate-13 08/15/2014  . Pneumococcal Polysaccharide-23 08/16/2011  . Zoster 08/16/2011   There are no preventive care reminders to display for this patient.  Patient Care Team: Isabella Confer, MD as PCP - General (Internal Medicine)  Indicate any recent Medical Services you may have received from other than [facility] providers in the past year (date may be approximate).     Assessment:   This is a routine wellness examination for [facility].  Hearing/Vision  screen  Hearing Screening   125Hz  250Hz  500Hz  1000Hz  2000Hz  4000Hz  8000Hz   Right ear:   Pass Pass Pass Pass   Left ear:   Pass Pass Pass Pass     Visual Acuity Screening   Right eye Left eye Both eyes  Without correction:     With correction: 20/20 20/20 20/20   Comments: Patient had Eye exam at my eye doctor. Exam done 10/23/14   Dietary issues and exercise activities discussed: Current Exercise Habits:: Home exercise routine, Type of exercise: walking, Time (Minutes): 15, Frequency (Times/Week): 6, Weekly Exercise (Minutes/Week): 90, Intensity: Mild  Goals    . Prevent Falls      Depression Screen PHQ 2/9 Scores 01/17/2015 08/15/2014 08/09/2013  PHQ - 2 Score 0 0 0    Fall Risk Fall Risk  01/17/2015 08/15/2014 08/09/2013  Falls in the past year? Yes No No  Number falls in past yr: 1 - -  Injury with Fall? No - -  Risk for fall due to : (No Data) - -  Risk for fall due to (comments): Patient tripped over a rug in the garge - -    Cognitive Function: MMSE - Mini Mental State Exam 01/17/2015  Orientation to time 5  Orientation to Place 5  Registration 3  Attention/ Calculation 5  Recall 3  Language- name 2 objects 2  Language- repeat 1  Language- follow 3 step command 3  Language- read & follow direction 1  Write a sentence 1  Copy design 1  Total score 30    Screening Tests Health Maintenance  Topic Date Due  . INFLUENZA VACCINE  04/30/2015  . MAMMOGRAM  08/17/2016  . TETANUS/TDAP  09/29/2018  . COLONOSCOPY  10/05/2024  . DEXA SCAN  Completed  . ZOSTAVAX  Completed  . PNA vac Low Risk Adult  Completed      Plan:     During the course of the visit, Isabella Hurst was educated and counseled about the following appropriate screening and preventive services:   Vaccines to include Pneumoccal, Influenza, Hepatitis B, Td, Zostavax, HCV  Electrocardiogram  Cardiovascular disease screening  Colorectal cancer screening  Bone density screening  Diabetes  screening  Glaucoma screening  Mammography/PAP  Nutrition counseling  Smoking cessation counseling  Patient Instructions (the written plan) were given to the patient.    Isabella Bers, LPN   1/[id number]/[id number]

## 2015-01-22 LAB — SURGICAL PATHOLOGY

## 2015-01-25 ENCOUNTER — Other Ambulatory Visit: Payer: Self-pay | Admitting: Internal Medicine

## 2015-02-12 ENCOUNTER — Ambulatory Visit (INDEPENDENT_AMBULATORY_CARE_PROVIDER_SITE_OTHER): Payer: Medicare Other | Admitting: Nurse Practitioner

## 2015-02-12 ENCOUNTER — Encounter: Payer: Self-pay | Admitting: Nurse Practitioner

## 2015-02-12 ENCOUNTER — Telehealth: Payer: Self-pay

## 2015-02-12 VITALS — BP 132/78 | HR 62 | Temp 98.3°F | Resp 16 | Ht 66.0 in | Wt 211.4 lb

## 2015-02-12 DIAGNOSIS — M5432 Sciatica, left side: Secondary | ICD-10-CM

## 2015-02-12 DIAGNOSIS — M545 Low back pain, unspecified: Secondary | ICD-10-CM

## 2015-02-12 MED ORDER — PREDNISONE 10 MG PO TABS: ORAL_TABLET | ORAL | Status: DC

## 2015-02-12 NOTE — Telephone Encounter (Signed)
Disregard msg, pt is seeing C.Doss at 2pm today (5/16)

## 2015-02-12 NOTE — Progress Notes (Signed)
   Subjective:    Patient ID: Isabella Hurst, female    DOB: 07-Jun-1946, 69 y.o.   MRN: [id number]  HPI  [name] is a 69 yo female with a CC of back pain x 1 day   1) Happened in past about every 2 years, sitting worst position, muscle spasms in Sept. Lasted 4 days   Toradol shot in ER- helpful   Saw Dr. Sharlet Salina- MRI found 2 bulging discs and 1 HNP,  Prednisone taper. Today: 1 day of back pain, midline and slightly out bilaterally, worse with riding in the car and sitting. Moderate pain.    Review of Systems  Constitutional: Negative for fever, chills, diaphoresis and fatigue.  Respiratory: Negative for chest tightness, shortness of breath and wheezing.   Cardiovascular: Negative for chest pain, palpitations and leg swelling.  Gastrointestinal: Negative for nausea, vomiting and diarrhea.  Musculoskeletal: Positive for back pain.  Skin: Negative for rash.  Neurological: Negative for dizziness, weakness, numbness and headaches.       Denies losing bowel/bladder function, saddle anesthesia, or weakness of extremities.   Psychiatric/Behavioral: The patient is not nervous/anxious.       Objective:   Physical Exam  Constitutional: She is oriented to person, place, and time. She appears well-developed and well-nourished. No distress.  BP 132/78 mmHg  Pulse 62  Temp(Src) 98.3 F (36.8 C)  Resp 16  Ht 5\' 6"  (1.676 m)  Wt 211 lb 6.4 oz (95.89 kg)  BMI 34.14 kg/m2  SpO2 96%   HENT:  Head: Normocephalic and atraumatic.  Right Ear: External ear normal.  Left Ear: External ear normal.  Cardiovascular: Normal rate, regular rhythm, normal heart sounds and intact distal pulses.  Exam reveals no gallop and no friction rub.   No murmur heard. Pulmonary/Chest: Effort normal and breath sounds normal. No respiratory distress. She has no wheezes. She has no rales. She exhibits no tenderness.  Musculoskeletal: Normal range of motion. She exhibits tenderness. She exhibits no edema.  Tender  midline and slightly bilaterally in the paraspinal muscles.   Neurological: She is alert and oriented to person, place, and time. No cranial nerve deficit. She exhibits normal muscle tone. Coordination normal.  Skin: Skin is warm and dry. No rash noted. She is not diaphoretic.  Psychiatric: She has a normal mood and affect. Her behavior is normal. Judgment and thought content normal.      Assessment & Plan:

## 2015-02-12 NOTE — Telephone Encounter (Signed)
The pt is having back spasms.  She was offered several different appointments, but refused stating last time she waited, she had to be seen at the ER.  She is hoping to have the steroid medication called in that was used at the beginning of the year.  Pt callback - [phone]

## 2015-02-12 NOTE — Assessment & Plan Note (Addendum)
Prednisone taper helpful in past. Pt prefers to start with 20 mg. Taper sent to pharmacy and AVS printed with instructions. FU prn worsening/failure to improve.

## 2015-02-12 NOTE — Progress Notes (Signed)
Pre visit review using our clinic review tool, if applicable. No additional management support is needed unless otherwise documented below in the visit note. 

## 2015-02-12 NOTE — Patient Instructions (Signed)
Call us if not helpful.   20 mg for 3 days and 10 mg for 4 days. Take with food!

## 2015-03-14 DIAGNOSIS — H25013 Cortical age-related cataract, bilateral: Secondary | ICD-10-CM | POA: Diagnosis not present

## 2015-04-04 DIAGNOSIS — F42 Obsessive-compulsive disorder: Secondary | ICD-10-CM | POA: Diagnosis not present

## 2015-04-04 DIAGNOSIS — F3342 Major depressive disorder, recurrent, in full remission: Secondary | ICD-10-CM | POA: Diagnosis not present

## 2015-05-24 DIAGNOSIS — H401494 Capsular glaucoma with pseudoexfoliation of lens, unspecified eye, indeterminate stage: Secondary | ICD-10-CM | POA: Diagnosis not present

## 2015-06-21 DIAGNOSIS — H401494 Capsular glaucoma with pseudoexfoliation of lens, unspecified eye, indeterminate stage: Secondary | ICD-10-CM | POA: Diagnosis not present

## 2015-06-28 ENCOUNTER — Encounter: Payer: Self-pay | Admitting: *Deleted

## 2015-06-28 DIAGNOSIS — H401492 Capsular glaucoma with pseudoexfoliation of lens, unspecified eye, moderate stage: Secondary | ICD-10-CM | POA: Diagnosis not present

## 2015-06-29 DIAGNOSIS — H2513 Age-related nuclear cataract, bilateral: Secondary | ICD-10-CM | POA: Diagnosis not present

## 2015-07-03 NOTE — Discharge Instructions (Signed)

## 2015-07-04 ENCOUNTER — Ambulatory Visit
Admission: RE | Admit: 2015-07-04 | Discharge: 2015-07-04 | Disposition: A | Discharge status: Home / Self Care | Payer: Medicare Other | Source: Ambulatory Visit | Attending: Ophthalmology | Admitting: Ophthalmology

## 2015-07-04 ENCOUNTER — Ambulatory Visit: Payer: Medicare Other | Admitting: Anesthesiology

## 2015-07-04 ENCOUNTER — Encounter
Admission: RE | Disposition: A | Discharge status: Home / Self Care | Payer: Self-pay | Source: Ambulatory Visit | Attending: Ophthalmology

## 2015-07-04 DIAGNOSIS — Z90710 Acquired absence of both cervix and uterus: Secondary | ICD-10-CM | POA: Diagnosis not present

## 2015-07-04 DIAGNOSIS — R600 Localized edema: Secondary | ICD-10-CM | POA: Insufficient documentation

## 2015-07-04 DIAGNOSIS — I10 Essential (primary) hypertension: Secondary | ICD-10-CM | POA: Diagnosis not present

## 2015-07-04 DIAGNOSIS — F329 Major depressive disorder, single episode, unspecified: Secondary | ICD-10-CM | POA: Insufficient documentation

## 2015-07-04 DIAGNOSIS — Z79899 Other long term (current) drug therapy: Secondary | ICD-10-CM | POA: Diagnosis not present

## 2015-07-04 DIAGNOSIS — L719 Rosacea, unspecified: Secondary | ICD-10-CM | POA: Insufficient documentation

## 2015-07-04 DIAGNOSIS — Z9884 Bariatric surgery status: Secondary | ICD-10-CM | POA: Diagnosis not present

## 2015-07-04 DIAGNOSIS — H2512 Age-related nuclear cataract, left eye: Secondary | ICD-10-CM | POA: Insufficient documentation

## 2015-07-04 DIAGNOSIS — K219 Gastro-esophageal reflux disease without esophagitis: Secondary | ICD-10-CM | POA: Insufficient documentation

## 2015-07-04 DIAGNOSIS — H269 Unspecified cataract: Secondary | ICD-10-CM | POA: Diagnosis present

## 2015-07-04 DIAGNOSIS — H2513 Age-related nuclear cataract, bilateral: Secondary | ICD-10-CM | POA: Diagnosis not present

## 2015-07-04 HISTORY — DX: Adverse effect of unspecified anesthetic, initial encounter: T41.45XA

## 2015-07-04 HISTORY — PX: CATARACT EXTRACTION W/PHACO: SHX586

## 2015-07-04 HISTORY — DX: Other complications of anesthesia, initial encounter: T88.59XA

## 2015-07-04 HISTORY — DX: Other intervertebral disc degeneration, lumbar region: M51.36

## 2015-07-04 HISTORY — DX: Other intervertebral disc displacement, lumbar region: M51.26

## 2015-07-04 HISTORY — DX: Other intervertebral disc degeneration, lumbar region without mention of lumbar back pain or lower extremity pain: M51.369

## 2015-07-04 HISTORY — DX: Asymptomatic varicose veins of unspecified lower extremity: I83.90

## 2015-07-04 HISTORY — DX: Unspecified osteoarthritis, unspecified site: M19.90

## 2015-07-04 SURGERY — PHACOEMULSIFICATION, CATARACT, WITH IOL INSERTION
Anesthesia: Monitor Anesthesia Care | Laterality: Left | Wound class: Clean

## 2015-07-04 MED ORDER — LACTATED RINGERS IV SOLN: INTRAVENOUS | Status: DC

## 2015-07-04 MED ORDER — TIMOLOL MALEATE 0.5 % OP SOLN
OPHTHALMIC | Status: DC | PRN
  Administered 2015-07-04: 1 [drp] via OPHTHALMIC

## 2015-07-04 MED ORDER — MIDAZOLAM HCL 2 MG/2ML IJ SOLN
INTRAMUSCULAR | Status: DC | PRN
  Administered 2015-07-04: 2 mg via INTRAVENOUS

## 2015-07-04 MED ORDER — NA HYALUR & NA CHOND-NA HYALUR 0.4-0.35 ML IO KIT
PACK | INTRAOCULAR | Status: DC | PRN
  Administered 2015-07-04: 1 mL via INTRAOCULAR

## 2015-07-04 MED ORDER — BRIMONIDINE TARTRATE 0.2 % OP SOLN
OPHTHALMIC | Status: DC | PRN
  Administered 2015-07-04: 1 [drp] via OPHTHALMIC

## 2015-07-04 MED ORDER — FENTANYL CITRATE (PF) 100 MCG/2ML IJ SOLN
INTRAMUSCULAR | Status: DC | PRN
  Administered 2015-07-04: 100 ug via INTRAVENOUS

## 2015-07-04 MED ORDER — ARMC OPHTHALMIC DILATING GEL
1.0000 "application " | OPHTHALMIC | Status: DC | PRN
  Administered 2015-07-04 (×2): 1 via OPHTHALMIC

## 2015-07-04 MED ORDER — CEFUROXIME OPHTHALMIC INJECTION 1 MG/0.1 ML
INJECTION | OPHTHALMIC | Status: DC | PRN
  Administered 2015-07-04: 0.1 mL via OPHTHALMIC

## 2015-07-04 MED ORDER — TETRACAINE HCL 0.5 % OP SOLN
1.0000 [drp] | OPHTHALMIC | Status: DC | PRN
  Administered 2015-07-04: 1 [drp] via OPHTHALMIC

## 2015-07-04 MED ORDER — EPINEPHRINE HCL 1 MG/ML IJ SOLN
INTRAOCULAR | Status: DC | PRN
  Administered 2015-07-04: 54 mL via OPHTHALMIC

## 2015-07-04 MED ORDER — POVIDONE-IODINE 5 % OP SOLN
1.0000 "application " | OPHTHALMIC | Status: DC | PRN
  Administered 2015-07-04: 1 via OPHTHALMIC

## 2015-07-04 SURGICAL SUPPLY — 26 items
CANNULA ANT/CHMB 27GA (MISCELLANEOUS) ×2 IMPLANT
GLOVE SURG LX 7.5 STRW (GLOVE) ×1
GLOVE SURG LX STRL 7.5 STRW (GLOVE) ×1 IMPLANT
GLOVE SURG TRIUMPH 8.0 PF LTX (GLOVE) ×2 IMPLANT
GOWN STRL REUS W/ TWL LRG LVL3 (GOWN DISPOSABLE) ×2 IMPLANT
GOWN STRL REUS W/TWL LRG LVL3 (GOWN DISPOSABLE) ×2
LENS IOL TECNIS 22.5 (Intraocular Lens) ×2 IMPLANT
LENS IOL TECNIS MONO 1P 22.5 (Intraocular Lens) ×1 IMPLANT
MARKER SKIN SURG W/RULER VIO (MISCELLANEOUS) ×2 IMPLANT
NDL RETROBULBAR .5 NSTRL (NEEDLE) IMPLANT
NEEDLE FILTER BLUNT 18X 1/2SAF (NEEDLE) ×1
NEEDLE FILTER BLUNT 18X1 1/2 (NEEDLE) ×1 IMPLANT
PACK CATARACT BRASINGTON (MISCELLANEOUS) ×2 IMPLANT
PACK EYE AFTER SURG (MISCELLANEOUS) ×2 IMPLANT
PACK OPTHALMIC (MISCELLANEOUS) ×2 IMPLANT
RING MALYGIN 7.0 (MISCELLANEOUS) IMPLANT
SUT ETHILON 10-0 CS-B-6CS-B-6 (SUTURE)
SUT VICRYL  9 0 (SUTURE)
SUT VICRYL 9 0 (SUTURE) IMPLANT
SUTURE EHLN 10-0 CS-B-6CS-B-6 (SUTURE) IMPLANT
SYR 3ML LL SCALE MARK (SYRINGE) ×2 IMPLANT
SYR 5ML LL (SYRINGE) IMPLANT
SYR TB 1ML LUER SLIP (SYRINGE) ×2 IMPLANT
WATER STERILE IRR 250ML POUR (IV SOLUTION) ×2 IMPLANT
WATER STERILE IRR 500ML POUR (IV SOLUTION) IMPLANT
WIPE NON LINTING 3.25X3.25 (MISCELLANEOUS) ×2 IMPLANT

## 2015-07-04 NOTE — Op Note (Signed)
OPERATIVE NOTE  Isabella Hurst [id number] 07/04/2015   PREOPERATIVE DIAGNOSIS:  Nuclear sclerotic cataract left eye. H25.12   POSTOPERATIVE DIAGNOSIS:    Nuclear sclerotic cataract left eye.     PROCEDURE:  Phacoemusification with posterior chamber intraocular lens placement of the left eye   LENS:   Implant Name Type Inv. Item Serial No. Manufacturer Lot No. LRB No. Used  LENS IMPL INTRAOC ZCB00 22.5 - [id number] Intraocular Lens LENS IMPL INTRAOC ZCB00 22.5 [id number] AMO   Left 1        ULTRASOUND TIME: 11  % of 0 minutes 48 seconds, CDE 5.3  SURGEON:  Wyonia Hough, MD   ANESTHESIA:  Topical with tetracaine drops and 2% Xylocaine jelly.   COMPLICATIONS:  None.   DESCRIPTION OF PROCEDURE:  The patient was identified in the holding room and transported to the operating room and placed in the supine position under the operating microscope.  The left eye was identified as the operative eye and it was prepped and draped in the usual sterile ophthalmic fashion.   A 1 millimeter clear-corneal paracentesis was made at the 1:30 position.  The anterior chamber was filled with Viscoat viscoelastic.  A 2.4 millimeter keratome was used to make a near-clear corneal incision at the 10:30 position.  .  A curvilinear capsulorrhexis was made with a cystotome and capsulorrhexis forceps.  Balanced salt solution was used to hydrodissect and hydrodelineate the nucleus.   Phacoemulsification was then used in stop and chop fashion to remove the lens nucleus and epinucleus.  The remaining cortex was then removed using the irrigation and aspiration handpiece. Provisc was then placed into the capsular bag to distend it for lens placement.  A lens was then injected into the capsular bag.  The remaining viscoelastic was aspirated.   Wounds were hydrated with balanced salt solution.  The anterior chamber was inflated to a physiologic pressure with balanced salt solution.  No wound leaks were noted.  Cefuroxime 0.1 ml of a 10mg /ml solution was injected into the anterior chamber for a dose of 1 mg of intracameral antibiotic at the completion of the case.   Timolol and Brimonidine drops were applied to the eye.  The patient was taken to the recovery room in stable condition without complications of anesthesia or surgery.  [name] 07/04/2015, 1:24 PM

## 2015-07-04 NOTE — Transfer of Care (Signed)
Immediate Anesthesia Transfer of Care Note  Patient: Isabella Hurst  Procedure(s) Performed: Procedure(s): CATARACT EXTRACTION PHACO AND INTRAOCULAR LENS PLACEMENT (IOC) (Left)  Patient Location: PACU  Anesthesia Type: MAC  Level of Consciousness: awake, alert  and patient cooperative  Airway and Oxygen Therapy: Patient Spontanous Breathing and Patient connected to supplemental oxygen  Post-op Assessment: Post-op Vital signs reviewed, Patient's Cardiovascular Status Stable, Respiratory Function Stable, Patent Airway and No signs of Nausea or vomiting  Post-op Vital Signs: Reviewed and stable  Complications: No apparent anesthesia complications

## 2015-07-04 NOTE — Anesthesia Postprocedure Evaluation (Signed)
  Anesthesia Post-op Note  Patient: Isabella Hurst  Procedure(s) Performed: Procedure(s): CATARACT EXTRACTION PHACO AND INTRAOCULAR LENS PLACEMENT (IOC) (Left)  Anesthesia type:MAC  Patient location: PACU  Post pain: Pain level controlled  Post assessment: Post-op Vital signs reviewed, Patient's Cardiovascular Status Stable, Respiratory Function Stable, Patent Airway and No signs of Nausea or vomiting  Post vital signs: Reviewed and stable  Last Vitals:  Filed Vitals:   07/04/15 1332  BP: 127/63  Pulse: 48  Temp:   Resp: 12    Level of consciousness: awake, alert  and patient cooperative  Complications: No apparent anesthesia complications

## 2015-07-04 NOTE — H&P (Signed)
  The History and Physical notes are on paper, have been signed, and are to be scanned. The patient remains stable and unchanged from the H&P.   Previous H&P reviewed, patient examined, and there are no changes.  Isabella Hurst 07/04/2015 11:52 AM

## 2015-07-04 NOTE — Anesthesia Preprocedure Evaluation (Signed)
Anesthesia Evaluation  Patient identified by MRN, date of birth, ID band  Reviewed: Allergy & Precautions, H&P , NPO status , Patient's Chart, lab work & pertinent test results  Airway Mallampati: II  TM Distance: >3 FB Neck ROM: full    Dental no notable dental hx.    Pulmonary    Pulmonary exam normal        Cardiovascular hypertension,  Rhythm:regular Rate:Normal     Neuro/Psych PSYCHIATRIC DISORDERS    GI/Hepatic GERD  ,  Endo/Other    Renal/GU      Musculoskeletal   Abdominal   Peds  Hematology   Anesthesia Other Findings   Reproductive/Obstetrics                             Anesthesia Physical Anesthesia Plan  ASA: II  Anesthesia Plan: MAC   Post-op Pain Management:    Induction:   Airway Management Planned:   Additional Equipment:   Intra-op Plan:   Post-operative Plan:   Informed Consent: I have reviewed the patients History and Physical, chart, labs and discussed the procedure including the risks, benefits and alternatives for the proposed anesthesia with the patient or authorized representative who has indicated his/her understanding and acceptance.     Plan Discussed with: CRNA  Anesthesia Plan Comments:         Anesthesia Quick Evaluation

## 2015-07-04 NOTE — Anesthesia Procedure Notes (Addendum)
Procedure Name: MAC Performed by: Londell Moh Pre-anesthesia Checklist: Patient identified, Emergency Drugs available, Suction available, Timeout performed and Patient being monitored Patient Re-evaluated:Patient Re-evaluated prior to inductionOxygen Delivery Method: Nasal cannula Placement Confirmation: positive ETCO2   Procedure Name: MAC Performed by: Londell Moh Pre-anesthesia Checklist: Patient identified, Emergency Drugs available, Suction available, Timeout performed and Patient being monitored Patient Re-evaluated:Patient Re-evaluated prior to inductionOxygen Delivery Method: Nasal cannula Placement Confirmation: positive ETCO2

## 2015-07-05 ENCOUNTER — Encounter: Payer: Self-pay | Admitting: Ophthalmology

## 2015-07-18 ENCOUNTER — Other Ambulatory Visit: Payer: Self-pay | Admitting: Internal Medicine

## 2015-07-25 DIAGNOSIS — F3342 Major depressive disorder, recurrent, in full remission: Secondary | ICD-10-CM | POA: Diagnosis not present

## 2015-07-25 DIAGNOSIS — H2511 Age-related nuclear cataract, right eye: Secondary | ICD-10-CM | POA: Diagnosis not present

## 2015-07-26 ENCOUNTER — Encounter: Payer: Self-pay | Admitting: *Deleted

## 2015-07-26 ENCOUNTER — Ambulatory Visit: Payer: Medicare Other

## 2015-07-31 NOTE — Discharge Instructions (Signed)

## 2015-08-01 ENCOUNTER — Ambulatory Visit: Payer: Medicare Other | Admitting: Anesthesiology

## 2015-08-01 ENCOUNTER — Ambulatory Visit
Admission: RE | Admit: 2015-08-01 | Discharge: 2015-08-01 | Disposition: A | Discharge status: Home / Self Care | Payer: Medicare Other | Source: Ambulatory Visit | Attending: Ophthalmology | Admitting: Ophthalmology

## 2015-08-01 ENCOUNTER — Encounter
Admission: RE | Disposition: A | Discharge status: Home / Self Care | Payer: Self-pay | Source: Ambulatory Visit | Attending: Ophthalmology

## 2015-08-01 DIAGNOSIS — Z888 Allergy status to other drugs, medicaments and biological substances status: Secondary | ICD-10-CM | POA: Insufficient documentation

## 2015-08-01 DIAGNOSIS — Z9849 Cataract extraction status, unspecified eye: Secondary | ICD-10-CM | POA: Diagnosis not present

## 2015-08-01 DIAGNOSIS — Z9884 Bariatric surgery status: Secondary | ICD-10-CM | POA: Insufficient documentation

## 2015-08-01 DIAGNOSIS — Z79899 Other long term (current) drug therapy: Secondary | ICD-10-CM | POA: Insufficient documentation

## 2015-08-01 DIAGNOSIS — K219 Gastro-esophageal reflux disease without esophagitis: Secondary | ICD-10-CM | POA: Diagnosis not present

## 2015-08-01 DIAGNOSIS — H2511 Age-related nuclear cataract, right eye: Secondary | ICD-10-CM | POA: Insufficient documentation

## 2015-08-01 DIAGNOSIS — F329 Major depressive disorder, single episode, unspecified: Secondary | ICD-10-CM | POA: Insufficient documentation

## 2015-08-01 DIAGNOSIS — I10 Essential (primary) hypertension: Secondary | ICD-10-CM | POA: Insufficient documentation

## 2015-08-01 DIAGNOSIS — Z90710 Acquired absence of both cervix and uterus: Secondary | ICD-10-CM | POA: Diagnosis not present

## 2015-08-01 DIAGNOSIS — L719 Rosacea, unspecified: Secondary | ICD-10-CM | POA: Diagnosis not present

## 2015-08-01 DIAGNOSIS — H269 Unspecified cataract: Secondary | ICD-10-CM | POA: Diagnosis present

## 2015-08-01 HISTORY — PX: CATARACT EXTRACTION W/PHACO: SHX586

## 2015-08-01 HISTORY — DX: Rosacea, unspecified: L71.9

## 2015-08-01 SURGERY — PHACOEMULSIFICATION, CATARACT, WITH IOL INSERTION
Anesthesia: Monitor Anesthesia Care | Laterality: Right | Wound class: Clean

## 2015-08-01 MED ORDER — CEFUROXIME OPHTHALMIC INJECTION 1 MG/0.1 ML
INJECTION | OPHTHALMIC | Status: DC | PRN
  Administered 2015-08-01: .3 mL via OPHTHALMIC

## 2015-08-01 MED ORDER — TIMOLOL MALEATE 0.5 % OP SOLN
OPHTHALMIC | Status: DC | PRN
  Administered 2015-08-01: 1 [drp] via OPHTHALMIC

## 2015-08-01 MED ORDER — TETRACAINE HCL 0.5 % OP SOLN
1.0000 [drp] | OPHTHALMIC | Status: DC | PRN
  Administered 2015-08-01: 1 [drp] via OPHTHALMIC

## 2015-08-01 MED ORDER — ARMC OPHTHALMIC DILATING GEL
1.0000 "application " | OPHTHALMIC | Status: DC | PRN
  Administered 2015-08-01 (×2): 1 via OPHTHALMIC

## 2015-08-01 MED ORDER — BRIMONIDINE TARTRATE 0.2 % OP SOLN
OPHTHALMIC | Status: DC | PRN
  Administered 2015-08-01: 1 [drp] via OPHTHALMIC

## 2015-08-01 MED ORDER — POVIDONE-IODINE 5 % OP SOLN
1.0000 "application " | OPHTHALMIC | Status: DC | PRN
  Administered 2015-08-01: 1 via OPHTHALMIC

## 2015-08-01 MED ORDER — TETRACAINE HCL 0.5 % OP SOLN
OPHTHALMIC | Status: DC | PRN
  Administered 2015-08-01: 3 [drp] via OPHTHALMIC

## 2015-08-01 MED ORDER — NA HYALUR & NA CHOND-NA HYALUR 0.4-0.35 ML IO KIT
PACK | INTRAOCULAR | Status: DC | PRN
  Administered 2015-08-01: 1 mL via INTRAOCULAR

## 2015-08-01 MED ORDER — FENTANYL CITRATE (PF) 100 MCG/2ML IJ SOLN
INTRAMUSCULAR | Status: DC | PRN
  Administered 2015-08-01 (×2): 50 ug via INTRAVENOUS

## 2015-08-01 MED ORDER — EPINEPHRINE HCL 1 MG/ML IJ SOLN
INTRAOCULAR | Status: DC | PRN
  Administered 2015-08-01: 62 mL via OPHTHALMIC

## 2015-08-01 MED ORDER — MIDAZOLAM HCL 2 MG/2ML IJ SOLN
INTRAMUSCULAR | Status: DC | PRN
  Administered 2015-08-01: 2 mg via INTRAVENOUS

## 2015-08-01 SURGICAL SUPPLY — 26 items
CANNULA ANT/CHMB 27GA (MISCELLANEOUS) ×3 IMPLANT
GLOVE SURG LX 7.5 STRW (GLOVE) ×2
GLOVE SURG LX STRL 7.5 STRW (GLOVE) ×1 IMPLANT
GLOVE SURG TRIUMPH 8.0 PF LTX (GLOVE) ×3 IMPLANT
GOWN STRL REUS W/ TWL LRG LVL3 (GOWN DISPOSABLE) ×2 IMPLANT
GOWN STRL REUS W/TWL LRG LVL3 (GOWN DISPOSABLE) ×4
LENS IOL TECNIS 22.5 (Intraocular Lens) ×3 IMPLANT
LENS IOL TECNIS MONO 1P 22.5 (Intraocular Lens) ×1 IMPLANT
MARKER SKIN SURG W/RULER VIO (MISCELLANEOUS) ×3 IMPLANT
NDL RETROBULBAR .5 NSTRL (NEEDLE) IMPLANT
NEEDLE FILTER BLUNT 18X 1/2SAF (NEEDLE) ×2
NEEDLE FILTER BLUNT 18X1 1/2 (NEEDLE) ×1 IMPLANT
PACK CATARACT BRASINGTON (MISCELLANEOUS) ×3 IMPLANT
PACK EYE AFTER SURG (MISCELLANEOUS) ×3 IMPLANT
PACK OPTHALMIC (MISCELLANEOUS) ×3 IMPLANT
RING MALYGIN 7.0 (MISCELLANEOUS) IMPLANT
SUT ETHILON 10-0 CS-B-6CS-B-6 (SUTURE)
SUT VICRYL  9 0 (SUTURE)
SUT VICRYL 9 0 (SUTURE) IMPLANT
SUTURE EHLN 10-0 CS-B-6CS-B-6 (SUTURE) IMPLANT
SYR 3ML LL SCALE MARK (SYRINGE) ×3 IMPLANT
SYR 5ML LL (SYRINGE) IMPLANT
SYR TB 1ML LUER SLIP (SYRINGE) ×3 IMPLANT
WATER STERILE IRR 250ML POUR (IV SOLUTION) ×3 IMPLANT
WATER STERILE IRR 500ML POUR (IV SOLUTION) IMPLANT
WIPE NON LINTING 3.25X3.25 (MISCELLANEOUS) ×3 IMPLANT

## 2015-08-01 NOTE — Anesthesia Procedure Notes (Signed)
Procedure Name: MAC Performed by: Cruz Bong Pre-anesthesia Checklist: Patient identified, Emergency Drugs available, Suction available, Timeout performed and Patient being monitored Patient Re-evaluated:Patient Re-evaluated prior to inductionOxygen Delivery Method: Nasal cannula Placement Confirmation: positive ETCO2       

## 2015-08-01 NOTE — Op Note (Signed)
LOCATION:  [place]   PREOPERATIVE DIAGNOSIS:    Nuclear sclerotic cataract right eye. H25.11   POSTOPERATIVE DIAGNOSIS:  Nuclear sclerotic cataract right eye.     PROCEDURE:  Phacoemusification with posterior chamber intraocular lens placement of the right eye   LENS:   Implant Name Type Inv. Item Serial No. Manufacturer Lot No. LRB No. Used  LENS IMPL INTRAOC ZCB00 22.5 - [id number] Intraocular Lens LENS IMPL INTRAOC ZCB00 22.5 [id number] AMO   Right 1        ULTRASOUND TIME: 12 % of 1 minutes, 4 seconds.  CDE 8.0   SURGEON:  Wyonia Hough, MD   ANESTHESIA:  Topical with tetracaine drops and 2% Xylocaine jelly.   COMPLICATIONS:  None.   DESCRIPTION OF PROCEDURE:  The patient was identified in the holding room and transported to the operating room and placed in the supine position under the operating microscope.  The right eye was identified as the operative eye and it was prepped and draped in the usual sterile ophthalmic fashion.   A 1 millimeter clear-corneal paracentesis was made at the 12:00 position.  The anterior chamber was filled with Viscoat viscoelastic.  A 2.4 millimeter keratome was used to make a near-clear corneal incision at the 9:00 position.  A curvilinear capsulorrhexis was made with a cystotome and capsulorrhexis forceps.  Balanced salt solution was used to hydrodissect and hydrodelineate the nucleus.   Phacoemulsification was then used in stop and chop fashion to remove the lens nucleus and epinucleus.  The remaining cortex was then removed using the irrigation and aspiration handpiece. Provisc was then placed into the capsular bag to distend it for lens placement.  A lens was then injected into the capsular bag.  The remaining viscoelastic was aspirated.   Wounds were hydrated with balanced salt solution.  The anterior chamber was inflated to a physiologic pressure with balanced salt solution.  No wound leaks were noted. Cefuroxime 0.1 ml of a  10mg /ml solution was injected into the anterior chamber for a dose of 1 mg of intracameral antibiotic at the completion of the case.   Timolol and Brimonidine drops were applied to the eye.  The patient was taken to the recovery room in stable condition without complications of anesthesia or surgery.   Keandria Berrocal 08/01/2015, 12:03 PM

## 2015-08-01 NOTE — Transfer of Care (Signed)
Immediate Anesthesia Transfer of Care Note  Patient: Isabella Hurst  Procedure(s) Performed: Procedure(s): CATARACT EXTRACTION PHACO AND INTRAOCULAR LENS PLACEMENT (IOC) (Right)  Patient Location: PACU  Anesthesia Type: MAC  Level of Consciousness: awake, alert  and patient cooperative  Airway and Oxygen Therapy: Patient Spontanous Breathing and Patient connected to supplemental oxygen  Post-op Assessment: Post-op Vital signs reviewed, Patient's Cardiovascular Status Stable, Respiratory Function Stable, Patent Airway and No signs of Nausea or vomiting  Post-op Vital Signs: Reviewed and stable  Complications: No apparent anesthesia complications

## 2015-08-01 NOTE — Anesthesia Preprocedure Evaluation (Signed)
Anesthesia Evaluation  Patient identified by MRN, date of birth, ID band  Reviewed: Allergy & Precautions, H&P , NPO status , Patient's Chart, lab work & pertinent test results  History of Anesthesia Complications (+) history of anesthetic complications  Airway Mallampati: II  TM Distance: >3 FB Neck ROM: full    Dental no notable dental hx.    Pulmonary    Pulmonary exam normal        Cardiovascular hypertension,  Rhythm:regular Rate:Normal     Neuro/Psych PSYCHIATRIC DISORDERS    GI/Hepatic GERD  ,  Endo/Other    Renal/GU      Musculoskeletal   Abdominal   Peds  Hematology   Anesthesia Other Findings   Reproductive/Obstetrics                             Anesthesia Physical  Anesthesia Plan  ASA: II  Anesthesia Plan: MAC   Post-op Pain Management:    Induction:   Airway Management Planned:   Additional Equipment:   Intra-op Plan:   Post-operative Plan:   Informed Consent: I have reviewed the patients History and Physical, chart, labs and discussed the procedure including the risks, benefits and alternatives for the proposed anesthesia with the patient or authorized representative who has indicated his/her understanding and acceptance.     Plan Discussed with: CRNA  Anesthesia Plan Comments:         Anesthesia Quick Evaluation

## 2015-08-01 NOTE — H&P (Signed)
  The History and Physical notes are on paper, have been signed, and are to be scanned. The patient remains stable and unchanged from the H&P.   Previous H&P reviewed, patient examined, and there are no changes.  Amiee Wiley 08/01/2015 10:44 AM

## 2015-08-01 NOTE — Anesthesia Postprocedure Evaluation (Signed)
  Anesthesia Post-op Note  Patient: Isabella Hurst  Procedure(s) Performed: Procedure(s): CATARACT EXTRACTION PHACO AND INTRAOCULAR LENS PLACEMENT (IOC) (Right)  Anesthesia type:MAC  Patient location: PACU  Post pain: Pain level controlled  Post assessment: Post-op Vital signs reviewed, Patient's Cardiovascular Status Stable, Respiratory Function Stable, Patent Airway and No signs of Nausea or vomiting  Post vital signs: Reviewed and stable  Last Vitals:  Filed Vitals:   08/01/15 1215  BP: 130/65  Pulse:   Temp:   Resp: 12    Level of consciousness: awake, alert  and patient cooperative  Complications: No apparent anesthesia complications

## 2015-08-02 ENCOUNTER — Encounter: Payer: Self-pay | Admitting: Ophthalmology

## 2015-08-30 ENCOUNTER — Ambulatory Visit (INDEPENDENT_AMBULATORY_CARE_PROVIDER_SITE_OTHER): Payer: Medicare Other | Admitting: Internal Medicine

## 2015-08-30 ENCOUNTER — Encounter: Payer: Self-pay | Admitting: Internal Medicine

## 2015-08-30 VITALS — BP 134/72 | HR 54 | Temp 97.9°F | Resp 17 | Ht 64.5 in | Wt 198.5 lb

## 2015-08-30 DIAGNOSIS — I10 Essential (primary) hypertension: Secondary | ICD-10-CM | POA: Diagnosis not present

## 2015-08-30 DIAGNOSIS — Z0000 Encounter for general adult medical examination without abnormal findings: Secondary | ICD-10-CM

## 2015-08-30 DIAGNOSIS — E559 Vitamin D deficiency, unspecified: Secondary | ICD-10-CM

## 2015-08-30 LAB — COMPREHENSIVE METABOLIC PANEL
ALT: 19 U/L (ref 0–35)
AST: 23 U/L (ref 0–37)
Albumin: 3.9 g/dL (ref 3.5–5.2)
Alkaline Phosphatase: 64 U/L (ref 39–117)
BILIRUBIN TOTAL: 0.8 mg/dL (ref 0.2–1.2)
BUN: 27 mg/dL — AB (ref 6–23)
CALCIUM: 9.4 mg/dL (ref 8.4–10.5)
CHLORIDE: 100 meq/L (ref 96–112)
CO2: 28 meq/L (ref 19–32)
CREATININE: 0.73 mg/dL (ref 0.40–1.20)
GFR: 83.93 mL/min (ref >60.00)
GLUCOSE: 113 mg/dL — AB (ref 70–99)
Potassium: 4 mEq/L (ref 3.5–5.1)
SODIUM: 138 meq/L (ref 135–145)
Total Protein: 7.2 g/dL (ref 6.0–8.3)

## 2015-08-30 LAB — LIPID PANEL
CHOL/HDL RATIO: 3
Cholesterol: 230 mg/dL — ABNORMAL HIGH (ref 0–200)
HDL: 66.5 mg/dL (ref >39.00)
LDL CALC: 147 mg/dL — AB (ref 0–99)
NONHDL: 163.8
TRIGLYCERIDES: 82 mg/dL (ref 0.0–149.0)
VLDL: 16.4 mg/dL (ref 0.0–40.0)

## 2015-08-30 LAB — TSH: TSH: 3.42 u[IU]/mL (ref 0.35–4.50)

## 2015-08-30 LAB — VITAMIN D 25 HYDROXY (VIT D DEFICIENCY, FRACTURES): VITD: 29.47 ng/mL — AB (ref 30.00–100.00)

## 2015-08-30 LAB — VITAMIN B12: Vitamin B-12: 1077 pg/mL — ABNORMAL HIGH (ref 211–911)

## 2015-08-30 LAB — HEMOGLOBIN A1C: Hgb A1c MFr Bld: 5.8 % (ref 4.6–6.5)

## 2015-08-30 NOTE — Progress Notes (Signed)
Subjective:    Patient ID: Isabella Hurst, female    DOB: May 29, 1946, 69 y.o.   MRN: [id number]  HPI  The patient is here for annual Medicare Wellness Examination and management of other chronic and acute problems.   The risk factors are reflected in the history.  The roster of all physicians providing medical care to patient - is listed in the Snapshot section of the chart.  Activities of daily living:   The patient is 100% independent in all ADLs: dressing, toileting, feeding as well as independent mobility. Patient lives with brother in a condo. No pets. One floor condo.  Home safety :  The patient has smoke detectors in the home.  They wear seatbelts in their car. There are no firearms at home.  There is no violence in the home. They feel safe where they live.  Infectious Risks: There is no risks for hepatitis, STDs or HIV.  There is no  history of blood transfusion.  They have no travel history to infectious disease endemic areas of the world.  Additional Health Care Providers: The patient has seen their dentist in the last six months. Dentist - Dr. Eugenie Birks They have seen their eye doctor in the last year. Had bilateral cataract replacement. Opthalmologist - [facility] They deny hearing issues. They have deferred audiologic testing in the last year.   They do not  have excessive sun exposure. Discussed the need for sun protection: hats,long sleeves and use of sunscreen if there is significant sun exposure.  Dermatologist - Dr. Nehemiah Massed, [facility] Psychiatrist - Dr. Nicolasa Ducking  Diet: the importance of a healthy diet is discussed. They do have a healthy diet. Limiting desserts. Wt Readings from Last 3 Encounters:  08/30/15 198 lb 8 oz (90.039 kg)  08/01/15 199 lb (90.266 kg)  07/04/15 203 lb (92.08 kg)    The benefits of regular aerobic exercise were discussed. Exercise has been limited. Going to a balance class at the senior center. Careful about  balance.  Depression screen: there are no signs or vegative symptoms of depression- irritability, change in appetite, anhedonia, sadness/tearfullness.  Cognitive assessment: the patient manages all their financial and personal affairs and is actively engaged. They could relate day,date,year and events.  89 - daughter, not set up yet  The following portions of the patient's history were reviewed and updated as appropriate: allergies, current medications, past family history, past medical history,  past surgical history, past social history and problem list.  Visual acuity was not assessed per patient preference as they have regular follow up with their ophthalmologist. Hearing and body mass index were assessed and reviewed.   During the course of the visit the patient was educated and counseled about appropriate screening and preventive services including : fall prevention , diabetes screening, nutrition counseling, colorectal cancer screening, and recommended immunizations.      Wt Readings from Last 3 Encounters:  08/30/15 198 lb 8 oz (90.039 kg)  08/01/15 199 lb (90.266 kg)  07/04/15 203 lb (92.08 kg)   BP Readings from Last 3 Encounters:  08/30/15 134/72  08/01/15 130/65  07/04/15 127/63    Past Medical History  Diagnosis Date  . Hypertension   . GERD (gastroesophageal reflux disease)   . Colon polyps   . Depression     Dr. Lyndle Herrlich from [place]  . Phlebitis   . Complication of anesthesia     c-section - saw fast moving, flashing lights through sedation.  NO issues since  .  Arthritis     feet  . Bulging lumbar disc     also, herniated disc  . Varicose vein   . Rosacea    Family History  Problem Relation Age of Onset  . Heart disease Mother   . Cancer Father     Lung cancer   Past Surgical History  Procedure Laterality Date  . Cesarean section      2  . Gastric bypass  1980  . Hernia repair  1995    incisional hernia  . Abdominal hysterectomy  1994     complete for fibroids  . Cataract extraction w/phaco Left 07/04/2015    Procedure: CATARACT EXTRACTION PHACO AND INTRAOCULAR LENS PLACEMENT (IOC);  Surgeon: Leandrew Koyanagi, MD;  Location: [facility];  Service: Ophthalmology;  Laterality: Left;  . Cataract extraction w/phaco Right 08/01/2015    Procedure: CATARACT EXTRACTION PHACO AND INTRAOCULAR LENS PLACEMENT (IOC);  Surgeon: Leandrew Koyanagi, MD;  Location: [facility];  Service: Ophthalmology;  Laterality: Right;   Social History   Social History  . Marital Status: Divorced    Spouse Name: N/A  . Number of Children: N/A  . Years of Education: N/A   Social History Main Topics  . Smoking status: Never Smoker   . Smokeless tobacco: Never Used  . Alcohol Use: No     Comment: wine, 3-4 times /year  . Drug Use: No  . Sexual Activity: Not Asked   Other Topics Concern  . None   Social History Narrative   Lives in [place]. Brother lives with her. From [place]. Widow, then divorced. 2 daughters, on deceased.      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      Diet - regular diet, no desserts, previously gluten free      Exercise - started walking program, daily    Review of Systems  Constitutional: Negative for fever, chills, appetite change, fatigue and unexpected weight change.  Eyes: Negative for visual disturbance.  Respiratory: Negative for shortness of breath.   Cardiovascular: Negative for chest pain and leg swelling.  Gastrointestinal: Negative for nausea, vomiting, abdominal pain, diarrhea and constipation.  Musculoskeletal: Negative for myalgias and arthralgias.  Skin: Negative for color change and rash.  Hematological: Negative for adenopathy. Does not bruise/bleed easily.  Psychiatric/Behavioral: Negative for dysphoric mood. The patient is not nervous/anxious.        Objective:    BP 134/72 mmHg  Pulse 54  Temp(Src) 97.9 F (36.6 C) (Oral)  Resp 17  Ht 5' 4.5" (1.638 m)  Wt 198 lb 8 oz  (90.039 kg)  BMI 33.56 kg/m2  SpO2 98% Physical Exam  Constitutional: She is oriented to person, place, and time. She appears well-developed and well-nourished. No distress.  HENT:  Head: Normocephalic and atraumatic.  Right Ear: External ear normal.  Left Ear: External ear normal.  Nose: Nose normal.  Mouth/Throat: Oropharynx is clear and moist. No oropharyngeal exudate.  Eyes: Conjunctivae are normal. Pupils are equal, round, and reactive to light. Right eye exhibits no discharge. Left eye exhibits no discharge. No scleral icterus.  Neck: Normal range of motion. Neck supple. No tracheal deviation present. No thyromegaly present.  Cardiovascular: Normal rate, regular rhythm, normal heart sounds and intact distal pulses.  Exam reveals no gallop and no friction rub.   No murmur heard. Pulmonary/Chest: Effort normal and breath sounds normal. No respiratory distress. She has no wheezes. She has no rales. She exhibits no tenderness.  Musculoskeletal: Normal range  of motion. She exhibits no edema or tenderness.  Lymphadenopathy:    She has no cervical adenopathy.  Neurological: She is alert and oriented to person, place, and time. No cranial nerve deficit. She exhibits normal muscle tone. Coordination normal.  Skin: Skin is warm and dry. No rash noted. She is not diaphoretic. No erythema. No pallor.  Psychiatric: She has a normal mood and affect. Her behavior is normal. Judgment and thought content normal.          Assessment & Plan:  Patient was given a handout regarding current recommendations for health maintenance and preventative care on the AVS.  Problem List Items Addressed This Visit      Unprioritized   Essential hypertension, benign   Relevant Orders   Comprehensive metabolic panel   Lipid panel   Hemoglobin A1c   TSH   B12   Medicare annual wellness visit, subsequent - Primary    General medical exam normal today. Pt declines breast exam. Pt is s/p hysterectomy.  Colonoscopy UTD. Mammogram scheduled. Labs as ordered. Encouraged healthy diet and exercise. Flu vaccine today.      Relevant Orders   MM Digital Screening    Other Visit Diagnoses    Vitamin D deficiency        Relevant Orders    VITAMIN D 25 Hydroxy (Vit-D Deficiency, Fractures)        Return in about 6 months (around 02/28/2016) for Recheck.

## 2015-08-30 NOTE — Patient Instructions (Signed)
Health Maintenance, Female Adopting a healthy lifestyle and getting preventive care can go a long way to promote health and wellness. Talk with your health care provider about what schedule of regular examinations is right for you. This is a good chance for you to check in with your provider about disease prevention and staying healthy. In between checkups, there are plenty of things you can do on your own. Experts have done a lot of research about which lifestyle changes and preventive measures are most likely to keep you healthy. Ask your health care provider for more information. WEIGHT AND DIET  Eat a healthy diet  Be sure to include plenty of vegetables, fruits, low-fat dairy products, and lean protein.  Do not eat a lot of foods high in solid fats, added sugars, or salt.  Get regular exercise. This is one of the most important things you can do for your health.  Most adults should exercise for at least 150 minutes each week. The exercise should increase your heart rate and make you sweat (moderate-intensity exercise).  Most adults should also do strengthening exercises at least twice a week. This is in addition to the moderate-intensity exercise.  Maintain a healthy weight  Body mass index (BMI) is a measurement that can be used to identify possible weight problems. It estimates body fat based on height and weight. Your health care provider can help determine your BMI and help you achieve or maintain a healthy weight.  For females 20 years of age and older:   A BMI below 18.5 is considered underweight.  A BMI of 18.5 to 24.9 is normal.  A BMI of 25 to 29.9 is considered overweight.  A BMI of 30 and above is considered obese.  Watch levels of cholesterol and blood lipids  You should start having your blood tested for lipids and cholesterol at 69 years of age, then have this test every 5 years.  You may need to have your cholesterol levels checked more often if:  Your lipid  or cholesterol levels are high.  You are older than 69 years of age.  You are at high risk for heart disease.  CANCER SCREENING   Lung Cancer  Lung cancer screening is recommended for adults 55-80 years old who are at high risk for lung cancer because of a history of smoking.  A yearly low-dose CT scan of the lungs is recommended for people who:  Currently smoke.  Have quit within the past 15 years.  Have at least a 30-pack-year history of smoking. A pack year is smoking an average of one pack of cigarettes a day for 1 year.  Yearly screening should continue until it has been 15 years since you quit.  Yearly screening should stop if you develop a health problem that would prevent you from having lung cancer treatment.  Breast Cancer  Practice breast self-awareness. This means understanding how your breasts normally appear and feel.  It also means doing regular breast self-exams. Let your health care provider know about any changes, no matter how small.  If you are in your 20s or 30s, you should have a clinical breast exam (CBE) by a health care provider every 1-3 years as part of a regular health exam.  If you are 40 or older, have a CBE every year. Also consider having a breast X-ray (mammogram) every year.  If you have a family history of breast cancer, talk to your health care provider about genetic screening.  If you   are at high risk for breast cancer, talk to your health care provider about having an MRI and a mammogram every year.  Breast cancer gene (BRCA) assessment is recommended for women who have family members with BRCA-related cancers. BRCA-related cancers include:  Breast.  Ovarian.  Tubal.  Peritoneal cancers.  Results of the assessment will determine the need for genetic counseling and BRCA1 and BRCA2 testing. Cervical Cancer Your health care provider may recommend that you be screened regularly for cancer of the pelvic organs (ovaries, uterus, and  vagina). This screening involves a pelvic examination, including checking for microscopic changes to the surface of your cervix (Pap test). You may be encouraged to have this screening done every 3 years, beginning at age 21.  For women ages 30-65, health care providers may recommend pelvic exams and Pap testing every 3 years, or they may recommend the Pap and pelvic exam, combined with testing for human papilloma virus (HPV), every 5 years. Some types of HPV increase your risk of cervical cancer. Testing for HPV may also be done on women of any age with unclear Pap test results.  Other health care providers may not recommend any screening for nonpregnant women who are considered low risk for pelvic cancer and who do not have symptoms. Ask your health care provider if a screening pelvic exam is right for you.  If you have had past treatment for cervical cancer or a condition that could lead to cancer, you need Pap tests and screening for cancer for at least 20 years after your treatment. If Pap tests have been discontinued, your risk factors (such as having a new sexual partner) need to be reassessed to determine if screening should resume. Some women have medical problems that increase the chance of getting cervical cancer. In these cases, your health care provider may recommend more frequent screening and Pap tests. Colorectal Cancer  This type of cancer can be detected and often prevented.  Routine colorectal cancer screening usually begins at 69 years of age and continues through 69 years of age.  Your health care provider may recommend screening at an earlier age if you have risk factors for colon cancer.  Your health care provider may also recommend using home test kits to check for hidden blood in the stool.  A small camera at the end of a tube can be used to examine your colon directly (sigmoidoscopy or colonoscopy). This is done to check for the earliest forms of colorectal  cancer.  Routine screening usually begins at age 50.  Direct examination of the colon should be repeated every 5-10 years through 69 years of age. However, you may need to be screened more often if early forms of precancerous polyps or small growths are found. Skin Cancer  Check your skin from head to toe regularly.  Tell your health care provider about any new moles or changes in moles, especially if there is a change in a mole's shape or color.  Also tell your health care provider if you have a mole that is larger than the size of a pencil eraser.  Always use sunscreen. Apply sunscreen liberally and repeatedly throughout the day.  Protect yourself by wearing long sleeves, pants, a wide-brimmed hat, and sunglasses whenever you are outside. HEART DISEASE, DIABETES, AND HIGH BLOOD PRESSURE   High blood pressure causes heart disease and increases the risk of stroke. High blood pressure is more likely to develop in:  People who have blood pressure in the high end   of the normal range (130-139/85-89 mm Hg).  People who are overweight or obese.  People who are African American.  If you are 38-23 years of age, have your blood pressure checked every 3-5 years. If you are 61 years of age or older, have your blood pressure checked every year. You should have your blood pressure measured twice--once when you are at a hospital or clinic, and once when you are not at a hospital or clinic. Record the average of the two measurements. To check your blood pressure when you are not at a hospital or clinic, you can use:  An automated blood pressure machine at a pharmacy.  A home blood pressure monitor.  If you are between 45 years and 39 years old, ask your health care provider if you should take aspirin to prevent strokes.  Have regular diabetes screenings. This involves taking a blood sample to check your fasting blood sugar level.  If you are at a normal weight and have a low risk for diabetes,  have this test once every three years after 68 years of age.  If you are overweight and have a high risk for diabetes, consider being tested at a younger age or more often. PREVENTING INFECTION  Hepatitis B  If you have a higher risk for hepatitis B, you should be screened for this virus. You are considered at high risk for hepatitis B if:  You were born in a country where hepatitis B is common. Ask your health care provider which countries are considered high risk.  Your parents were born in a high-risk country, and you have not been immunized against hepatitis B (hepatitis B vaccine).  You have HIV or AIDS.  You use needles to inject street drugs.  You live with someone who has hepatitis B.  You have had sex with someone who has hepatitis B.  You get hemodialysis treatment.  You take certain medicines for conditions, including cancer, organ transplantation, and autoimmune conditions. Hepatitis C  Blood testing is recommended for:  Everyone born from [birth year] through 1965.  Anyone with known risk factors for hepatitis C. Sexually transmitted infections (STIs)  You should be screened for sexually transmitted infections (STIs) including gonorrhea and chlamydia if:  You are sexually active and are younger than 69 years of age.  You are older than 69 years of age and your health care provider tells you that you are at risk for this type of infection.  Your sexual activity has changed since you were last screened and you are at an increased risk for chlamydia or gonorrhea. Ask your health care provider if you are at risk.  If you do not have HIV, but are at risk, it may be recommended that you take a prescription medicine daily to prevent HIV infection. This is called pre-exposure prophylaxis (PrEP). You are considered at risk if:  You are sexually active and do not regularly use condoms or know the HIV status of your partner(s).  You take drugs by injection.  You are sexually  active with a partner who has HIV. Talk with your health care provider about whether you are at high risk of being infected with HIV. If you choose to begin PrEP, you should first be tested for HIV. You should then be tested every 3 months for as long as you are taking PrEP.  PREGNANCY   If you are premenopausal and you may become pregnant, ask your health care provider about preconception counseling.  If you may  become pregnant, take 400 to 800 micrograms (mcg) of folic acid every day.  If you want to prevent pregnancy, talk to your health care provider about birth control (contraception). OSTEOPOROSIS AND MENOPAUSE   Osteoporosis is a disease in which the bones lose minerals and strength with aging. This can result in serious bone fractures. Your risk for osteoporosis can be identified using a bone density scan.  If you are 61 years of age or older, or if you are at risk for osteoporosis and fractures, ask your health care provider if you should be screened.  Ask your health care provider whether you should take a calcium or vitamin D supplement to lower your risk for osteoporosis.  Menopause may have certain physical symptoms and risks.  Hormone replacement therapy may reduce some of these symptoms and risks. Talk to your health care provider about whether hormone replacement therapy is right for you.  HOME CARE INSTRUCTIONS   Schedule regular health, dental, and eye exams.  Stay current with your immunizations.   Do not use any tobacco products including cigarettes, chewing tobacco, or electronic cigarettes.  If you are pregnant, do not drink alcohol.  If you are breastfeeding, limit how much and how often you drink alcohol.  Limit alcohol intake to no more than 1 drink per day for nonpregnant women. One drink equals 12 ounces of beer, 5 ounces of wine, or 1 ounces of hard liquor.  Do not use street drugs.  Do not share needles.  Ask your health care provider for help if  you need support or information about quitting drugs.  Tell your health care provider if you often feel depressed.  Tell your health care provider if you have ever been abused or do not feel safe at home.   This information is not intended to replace advice given to you by your health care provider. Make sure you discuss any questions you have with your health care provider.   Document Released: 03/31/2011 Document Revised: 10/06/2014 Document Reviewed: 08/17/2013 Elsevier Interactive Patient Education [employer].

## 2015-08-30 NOTE — Assessment & Plan Note (Signed)
General medical exam normal today. Pt declines breast exam. Pt is s/p hysterectomy. Colonoscopy UTD. Mammogram scheduled. Labs as ordered. Encouraged healthy diet and exercise. Flu vaccine today.

## 2015-08-30 NOTE — Progress Notes (Signed)
Pre visit review using our clinic review tool, if applicable. No additional management support is needed unless otherwise documented below in the visit note. 

## 2015-09-04 DIAGNOSIS — H35352 Cystoid macular degeneration, left eye: Secondary | ICD-10-CM | POA: Diagnosis not present

## 2015-10-16 DIAGNOSIS — H35352 Cystoid macular degeneration, left eye: Secondary | ICD-10-CM | POA: Diagnosis not present

## 2015-10-17 ENCOUNTER — Ambulatory Visit: Payer: Medicare Other | Attending: Internal Medicine

## 2015-10-22 DIAGNOSIS — F3342 Major depressive disorder, recurrent, in full remission: Secondary | ICD-10-CM | POA: Diagnosis not present

## 2015-11-01 ENCOUNTER — Ambulatory Visit (INDEPENDENT_AMBULATORY_CARE_PROVIDER_SITE_OTHER): Payer: Medicare Other | Admitting: Family Medicine

## 2015-11-01 VITALS — BP 122/76 | HR 68 | Temp 99.3°F | Wt 201.6 lb

## 2015-11-01 DIAGNOSIS — J209 Acute bronchitis, unspecified: Secondary | ICD-10-CM

## 2015-11-01 MED ORDER — DEXTROMETHORPHAN POLISTIREX ER 30 MG/5ML PO SUER: 30.0000 mg | Freq: Two times a day (BID) | ORAL | Status: AC

## 2015-11-01 MED ORDER — DOXYCYCLINE HYCLATE 100 MG PO TABS: 100.0000 mg | ORAL_TABLET | Freq: Two times a day (BID) | ORAL | Status: DC

## 2015-11-01 MED ORDER — PREDNISONE 20 MG PO TABS: 20.0000 mg | ORAL_TABLET | Freq: Every day | ORAL | Status: DC

## 2015-11-01 NOTE — Progress Notes (Signed)
Pre visit review using our clinic review tool, if applicable. No additional management support is needed unless otherwise documented below in the visit note. 

## 2015-11-01 NOTE — Patient Instructions (Signed)
Nice to see you.  we will treat you with prednisone daily for the next 5 days. You likely have bronchitis.  If you do not improve in the next 1-2 days with the prednisone you can take doxycycline 100 mg twice daily by mouth. You should only take this if you do not improve. If you develop new or worsening symptoms or fever you need to be evaluated again.  If you develop chest pain, shortness of breath, cough productive of blood, fevers, or any new or change in symptoms please seek medical attention.

## 2015-11-02 ENCOUNTER — Encounter: Payer: Self-pay | Admitting: Family Medicine

## 2015-11-02 DIAGNOSIS — J209 Acute bronchitis, unspecified: Secondary | ICD-10-CM | POA: Insufficient documentation

## 2015-11-02 NOTE — Progress Notes (Signed)
Patient ID: Isabella Hurst, female   DOB: 03-15-1946, 70 y.o.   MRN: [id number]  Tommi Rumps, MD Phone: [phone]  Isabella Hurst is a 70 y.o. female who presents today for same-day visit.  Patient notes onset of cough and chest congestion 4 days ago. Notes cough is productive of green mucus. Notes mild sore throat. Blowing clear mucus out of her nose. Some ear discomfort as well. She notes chills and sweats at night. Has not checked her temperature. No shortness of breath or blood in her sputum. Does note some posttussive emesis. No chest pain. No nausea. No diarrhea.  PMH: nonsmoker.   ROS see history of present illness  Objective  Physical Exam Filed Vitals:   11/01/15 1512  BP: 122/76  Pulse: 68  Temp: 99.3 F (37.4 C)   Physical Exam  Constitutional: She is well-developed, well-nourished, and in no distress.  HENT:  Head: Normocephalic and atraumatic.  Right Ear: External ear normal.  Left Ear: External ear normal.  Mouth/Throat: Oropharynx is clear and moist. No oropharyngeal exudate.  Eyes: Conjunctivae are normal. Pupils are equal, round, and reactive to light.  Neck: Neck supple.  Cardiovascular: Normal rate, regular rhythm and normal heart sounds.  Exam reveals no gallop and no friction rub.   No murmur heard. Pulmonary/Chest: Effort normal and breath sounds normal. No respiratory distress. She has no wheezes. She has no rales.  Lymphadenopathy:    She has no cervical adenopathy.  Neurological: She is alert. Gait normal.  Skin: Skin is warm and dry. She is not diaphoretic.     Assessment/Plan: Please see individual problem list.  Acute bronchitis Patient's symptoms most consistent with bronchitis. We'll treat with prednisone. I advised a prednisone taper or 40 mg a day of prednisone for 5 days, though patient declined these doses and wanted to do 20 mg daily. She will try this for 1-2 days and then if not improved start on the doxycycline. She states  she has doxycycline at home that she takes as needed for rosacea though has not taken this in a number of weeks. She does not want a new prescription of doxycycline and states she will take some of her home supply. She will start on this 100 mg twice daily for 7 days if she does not improve. She was advised that if her symptoms worsen or change in his reevaluated prior to starting on the antibiotic. Delsym over-the-counter for cough. She is given return precautions.    Meds ordered this encounter  Medications  . predniSONE (DELTASONE) 20 MG tablet    Sig: Take 1 tablet (20 mg total) by mouth daily with breakfast.    Dispense:  5 tablet    Refill:  0  . doxycycline (VIBRA-TABS) 100 MG tablet    Sig: Take 1 tablet (100 mg total) by mouth 2 (two) times daily.    Dispense:  14 tablet    Refill:  0  . dextromethorphan (DELSYM) 30 MG/5ML liquid    Sig: Take 5 mLs (30 mg total) by mouth 2 (two) times daily.    Dispense:  89 mL    Refill:  0     Tommi Rumps

## 2015-11-02 NOTE — Assessment & Plan Note (Addendum)
Patient's symptoms most consistent with bronchitis. We'll treat with prednisone. I advised a prednisone taper or 40 mg a day of prednisone for 5 days, though patient declined these doses and wanted to do 20 mg daily. She will try this for 1-2 days and then if not improved start on the doxycycline. She states she has doxycycline at home that she takes as needed for rosacea though has not taken this in a number of weeks. She does not want a new prescription of doxycycline and states she will take some of her home supply. She will start on this 100 mg twice daily for 7 days if she does not improve. She was advised that if her symptoms worsen or change in his reevaluated prior to starting on the antibiotic. Delsym over-the-counter for cough. She is given return precautions.

## 2015-11-04 ENCOUNTER — Other Ambulatory Visit: Payer: Self-pay | Admitting: Internal Medicine

## 2015-11-13 ENCOUNTER — Ambulatory Visit (INDEPENDENT_AMBULATORY_CARE_PROVIDER_SITE_OTHER): Payer: Medicare Other | Admitting: Family Medicine

## 2015-11-13 ENCOUNTER — Encounter: Payer: Self-pay | Admitting: Family Medicine

## 2015-11-13 VITALS — BP 124/72 | HR 57 | Temp 98.4°F | Ht 64.5 in | Wt 190.5 lb

## 2015-11-13 DIAGNOSIS — M26629 Arthralgia of temporomandibular joint, unspecified side: Secondary | ICD-10-CM | POA: Diagnosis not present

## 2015-11-13 MED ORDER — MELOXICAM 15 MG PO TABS: 15.0000 mg | ORAL_TABLET | Freq: Every day | ORAL | Status: DC | PRN

## 2015-11-13 NOTE — Progress Notes (Signed)
Pre visit review using our clinic review tool, if applicable. No additional management support is needed unless otherwise documented below in the visit note. 

## 2015-11-13 NOTE — Progress Notes (Signed)
Subjective:  Patient ID: Isabella Hurst, female    DOB: Jan 27, 1946  Age: 70 y.o. MRN: [id number]  CC: Left ear/jaw pain  HPI:  70 year old female presents to clinic today with the above complaints.  Patient states that she's had a one-week history of left ear/jaw pain. She states that it starts in the front of her ear and then radiates down the mandible. She describes it as a dull ache. She states that she feels like it started when she opened her mouth and felt significant pain. No known relieving factors. No interventions tried. No other associated symptoms.  Social Hx   Social History   Social History  . Marital Status: Divorced    Spouse Name: N/A  . Number of Children: N/A  . Years of Education: N/A   Social History Main Topics  . Smoking status: Never Smoker   . Smokeless tobacco: Never Used  . Alcohol Use: No     Comment: wine, 3-4 times /year  . Drug Use: No  . Sexual Activity: Not Asked   Other Topics Concern  . None   Social History Narrative   Lives in [place]. Brother lives with her. From [place]. Widow, then divorced. 2 daughters, on deceased.      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      Diet - regular diet, no desserts, previously gluten free      Exercise - started walking program, daily   Review of Systems  Constitutional: Negative.   HENT:       Ear/jaw pain.    Objective:  BP 124/72 mmHg  Pulse 57  Temp(Src) 98.4 F (36.9 C) (Oral)  Ht 5' 4.5" (1.638 m)  Wt 190 lb 8 oz (86.41 kg)  BMI 32.21 kg/m2  SpO2 97%  BP/Weight 11/13/2015 11/01/2015 [id number]  Systolic BP [id number]  Diastolic BP 72 76 72  Wt. (Lbs) 190.5 201.6 198.5  BMI 32.21 34.08 33.56   Physical Exam  Constitutional: She appears well-developed. No distress.  HENT:  Head: Normocephalic and atraumatic.  Right Ear: External ear normal.  Left Ear: External ear normal.  Mouth/Throat: Oropharynx is clear and moist.  Normal TM's bilaterally.  L temporal mandibular  joint tender palpation. Worsened with opening of the mouth.  Pulmonary/Chest: Effort normal.  Neurological: She is alert.  Psychiatric: She has a normal mood and affect.  Vitals reviewed.  Lab Results  Component Value Date   WBC 5.2 08/15/2014   HGB 15.6* 08/15/2014   HCT 46.4* 08/15/2014   PLT 267.0 08/15/2014   GLUCOSE 113* 08/30/2015   CHOL 230* 08/30/2015   TRIG 82.0 08/30/2015   HDL 66.50 08/30/2015   LDLDIRECT 128.1 06/09/2013   LDLCALC 147* 08/30/2015   ALT 19 08/30/2015   AST 23 08/30/2015   NA 138 08/30/2015   K 4.0 08/30/2015   CL 100 08/30/2015   CREATININE 0.73 08/30/2015   BUN 27* 08/30/2015   CO2 28 08/30/2015   TSH 3.42 08/30/2015   HGBA1C 5.8 08/30/2015   MICROALBUR 1.4 08/15/2014   Assessment & Plan:   Problem List Items Addressed This Visit    TMJ tenderness - Primary    New problem. History and exam consistent with TMJ. Treating with Mobic. Exercises given. Follow up as needed.         Meds ordered this encounter  Medications  . meloxicam (MOBIC) 15 MG tablet    Sig: Take 1 tablet (15 mg total) by mouth daily as  needed for pain.    Dispense:  30 tablet    Refill:  0   Follow-up: PRN  [place]

## 2015-11-13 NOTE — Patient Instructions (Signed)
Take the meloxicam daily as needed (for the next 1-2 weeks).  Do the exercises.  Follow up if you fail to improve or worsen.  Take care  Dr. Lacinda Axon   Temporomandibular Joint Syndrome Temporomandibular joint (TMJ) syndrome is a condition that affects the joints between your jaw and your skull. The TMJs are located near your ears and allow your jaw to open and close. These joints and the nearby muscles are involved in all movements of the jaw. People with TMJ syndrome have pain in the area of these joints and muscles. Chewing, biting, or other movements of the jaw can be difficult or painful. TMJ syndrome can be caused by various things. In many cases, the condition is mild and goes away within a few weeks. For some people, the condition can become a long-term problem. CAUSES Possible causes of TMJ syndrome include:  Grinding your teeth or clenching your jaw. Some people do this when they are under stress.  Arthritis.  Injury to the jaw.  Head or neck injury.  Teeth or dentures that are not aligned well. In some cases, the cause of TMJ syndrome may not be known. SIGNS AND SYMPTOMS The most common symptom is an aching pain on the side of the head in the area of the TMJ. Other symptoms may include:  Pain when moving your jaw, such as when chewing or biting.  Being unable to open your jaw all the way.  Making a clicking sound when you open your mouth.  Headache.  Earache.  Neck or shoulder pain. DIAGNOSIS Diagnosis can usually be made based on your symptoms, your medical history, and a physical exam. Your health care provider may check the range of motion of your jaw. Imaging tests, such as X-rays or an MRI, are sometimes done. You may need to see your dentist to determine if your teeth and jaw are lined up correctly. TREATMENT TMJ syndrome often goes away on its own. If treatment is needed, the options may include:  Eating soft foods and applying ice or heat.  Medicines to  relieve pain or inflammation.  Medicines to relax the muscles.  A splint, bite plate, or mouthpiece to prevent teeth grinding or jaw clenching.  Relaxation techniques or counseling to help reduce stress.  Transcutaneous electrical nerve stimulation (TENS). This helps to relieve pain by applying an electrical current through the skin.  Acupuncture. This is sometimes helpful to relieve pain.  Jaw surgery. This is rarely needed. HOME CARE INSTRUCTIONS  Take medicines only as directed by your health care provider.  Eat a soft diet if you are having trouble chewing.  Apply ice to the painful area.  Put ice in a plastic bag.  Place a towel between your skin and the bag.  Leave the ice on for 20 minutes, 2-3 times a day.  Apply a warm compress to the painful area as directed.  Massage your jaw area and perform any jaw stretching exercises as recommended by your health care provider.  If you were given a mouthpiece or bite plate, wear it as directed.  Avoid foods that require a lot of chewing. Do not chew gum.  Keep all follow-up visits as directed by your health care provider. This is important. SEEK MEDICAL CARE IF:  You are having trouble eating.  You have new or worsening symptoms. SEEK IMMEDIATE MEDICAL CARE IF:  Your jaw locks open or closed.   This information is not intended to replace advice given to you by your health  care provider. Make sure you discuss any questions you have with your health care provider.   Document Released: 06/10/2001 Document Revised: 10/06/2014 Document Reviewed: 04/20/2014 Elsevier Interactive Patient Education [employer].

## 2015-11-13 NOTE — Assessment & Plan Note (Signed)
New problem. History and exam consistent with TMJ. Treating with Mobic. Exercises given. Follow up as needed.

## 2015-11-15 DIAGNOSIS — H401492 Capsular glaucoma with pseudoexfoliation of lens, unspecified eye, moderate stage: Secondary | ICD-10-CM | POA: Diagnosis not present

## 2015-12-09 ENCOUNTER — Other Ambulatory Visit: Payer: Self-pay | Admitting: Family Medicine

## 2015-12-10 NOTE — Telephone Encounter (Signed)
Prescribed 11/16/15 by Dr.Cook. Please advise?

## 2015-12-17 IMAGING — CT CT ABD-PELV W/O CM
1 of 2 series · 15 of 32 positions shown, 19 images · non-contrast
Comparison: None.

CLINICAL DATA: Left flank pain.  Microhematuria.

EXAM:
CT ABDOMEN AND PELVIS WITHOUT CONTRAST
TECHNIQUE: Multidetector CT imaging of the abdomen and pelvis was performed
following the standard protocol without IV contrast.

[Series 2: stone standard full · axial · 0.82mm/px · z∈[+246,+661]mm · 15 of 91 slices shown, 19 images]
[im 4/91  soft-tissue]
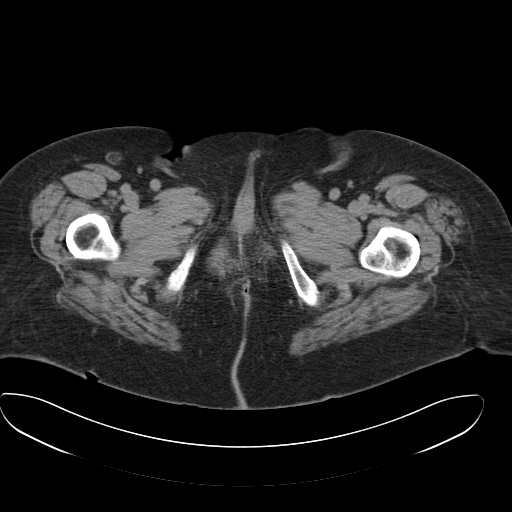
[im 4/91  bone]
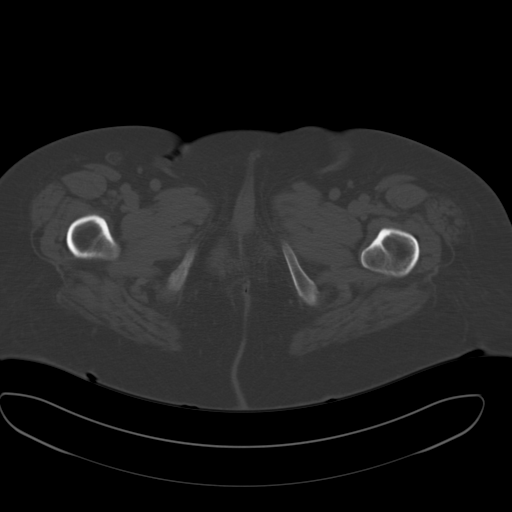
[im 11/91  soft-tissue]
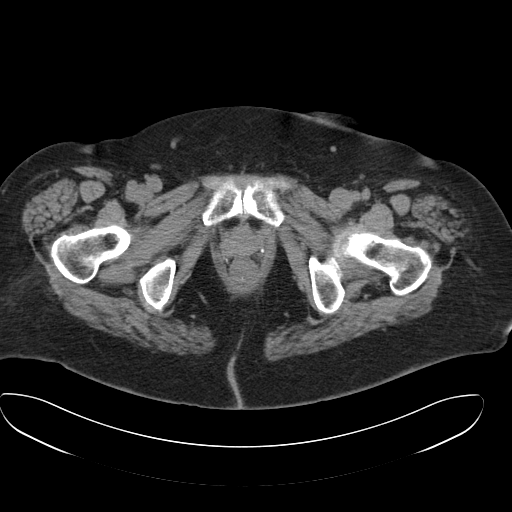
[im 19/91  soft-tissue]
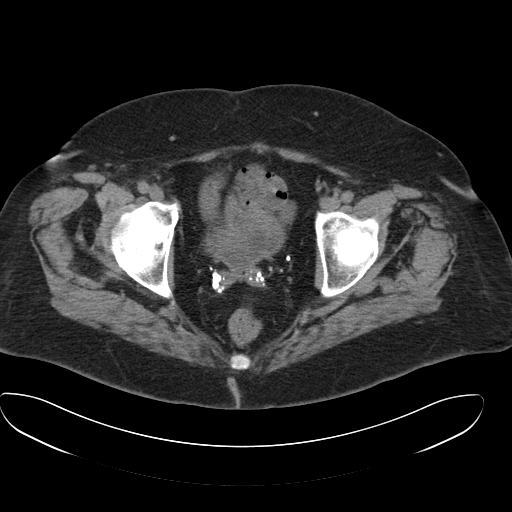
[im 26/91  soft-tissue]
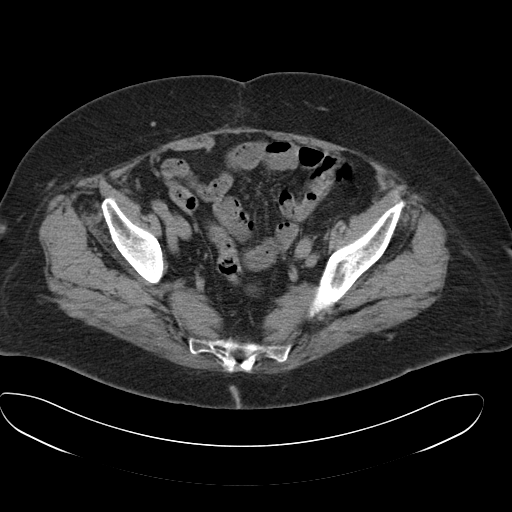
[im 33/91  soft-tissue]
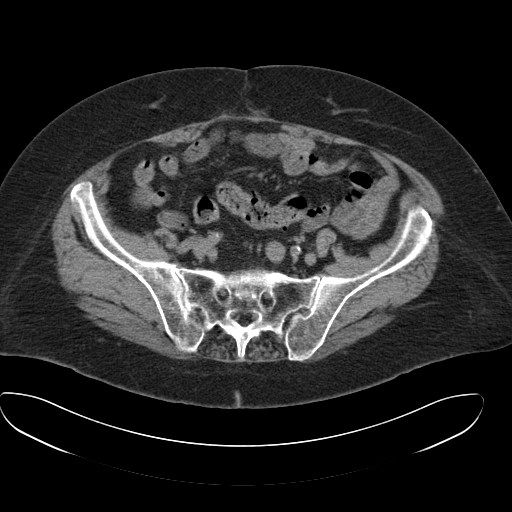
[im 40/91  soft-tissue]
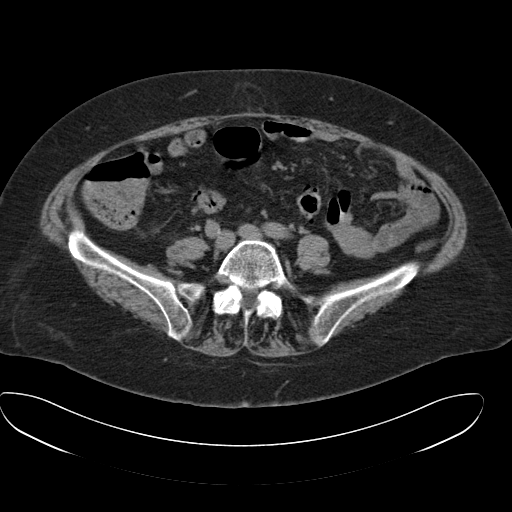
[im 47/91  soft-tissue]
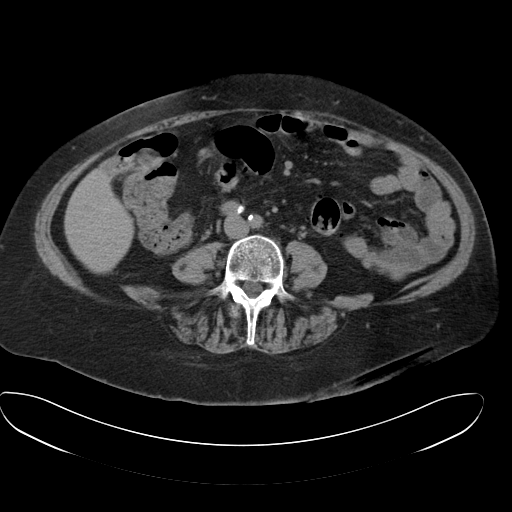
[im 51/91  soft-tissue]
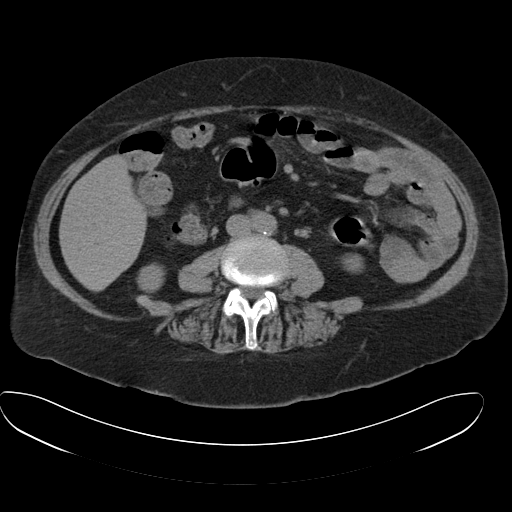
[im 58/91  soft-tissue]
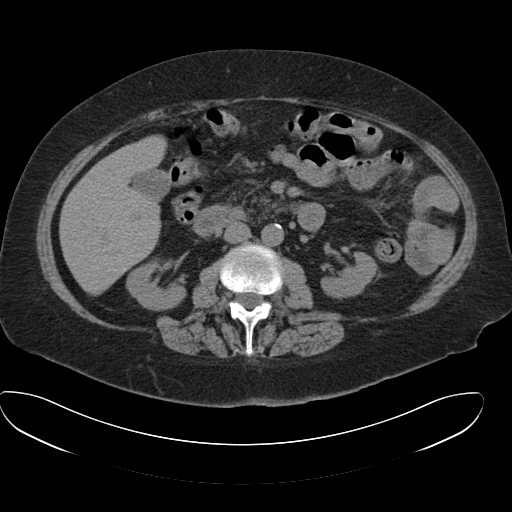
[im 58/91  bone]
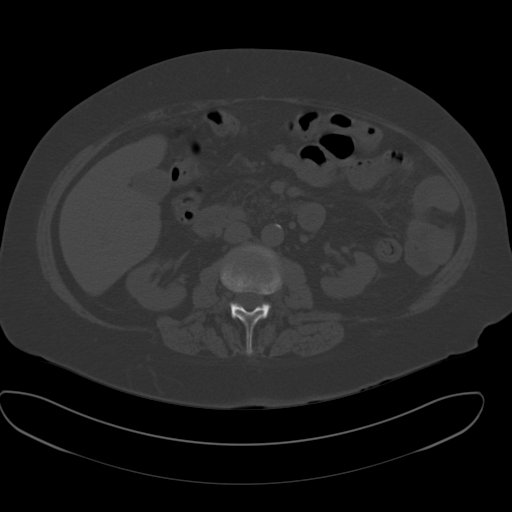
[im 65/91  soft-tissue]
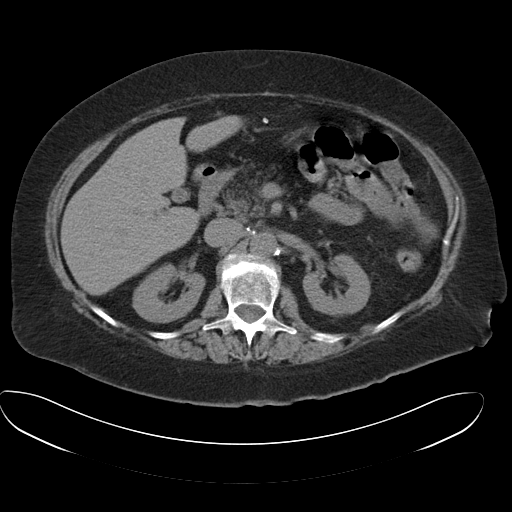
[im 73/91  soft-tissue]
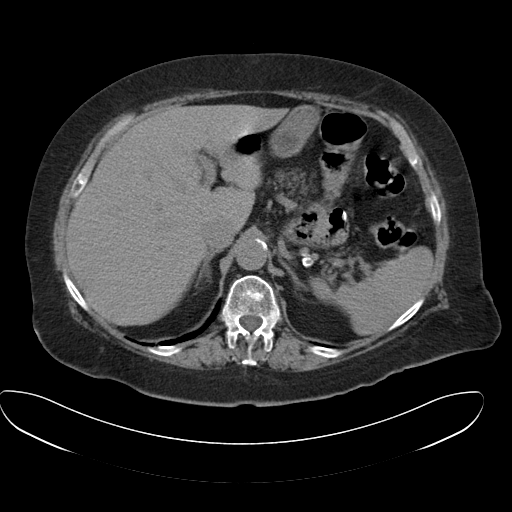
[im 76/91  lung]
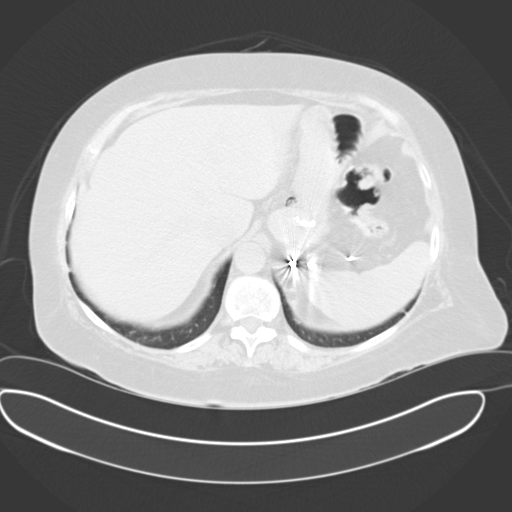
[im 80/91  soft-tissue]
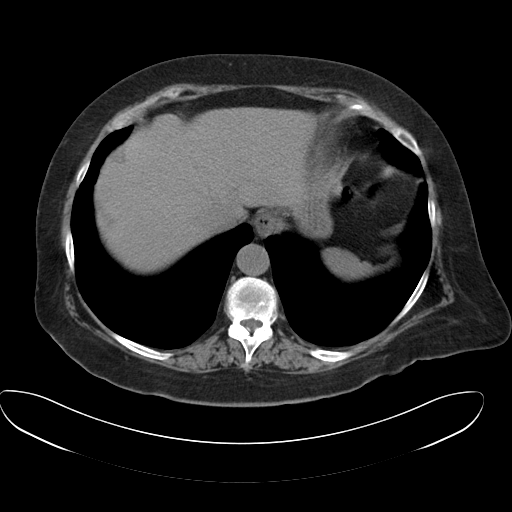
[im 80/91  lung]
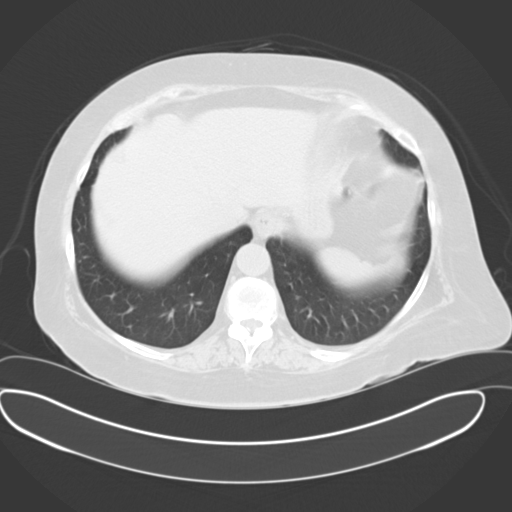
[im 83/91  lung]
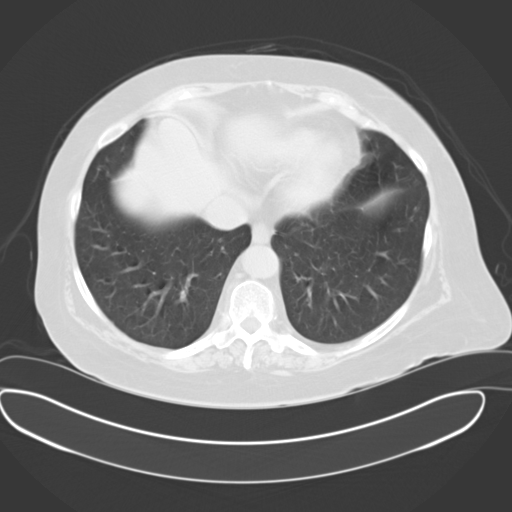
[im 87/91  soft-tissue]
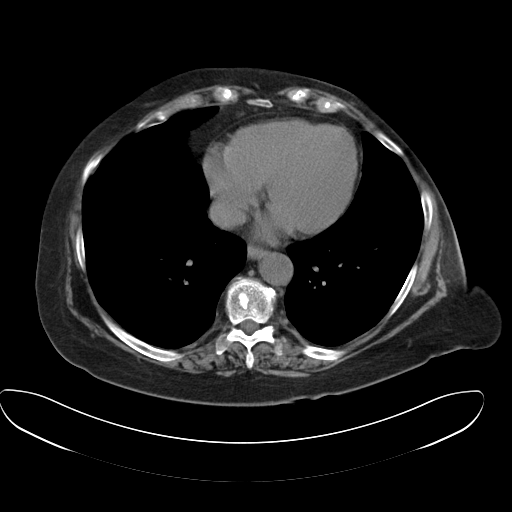
[im 87/91  lung]
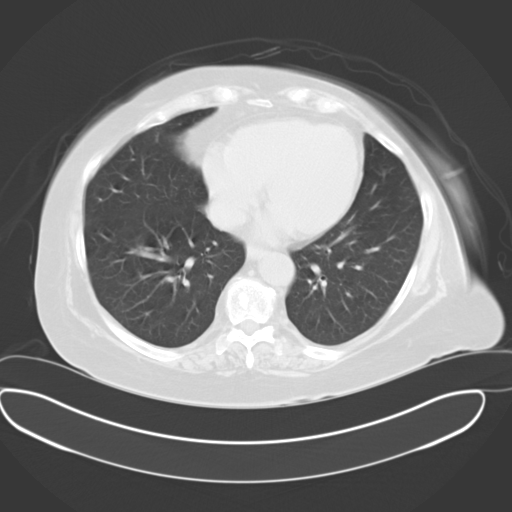

[15 of 32 positions shown; findings below may reference images not displayed]

FINDINGS: Multilevel degenerative disc disease is noted in the lumbar spine.
Visualized lung bases appear normal.

Gallstones are noted. No focal abnormality is noted in the liver or
spleen on these unenhanced images. Fatty replacement of the pancreas
is noted. Postsurgical changes are noted in the left upper quadrant
consistent with gastric bypass. Right adrenal gland appears normal.
1.7 cm fat containing left adrenal mass is noted most consistent
with benign adenoma. No hydronephrosis or renal obstruction is
noted. No renal or ureteral calculi are noted. The appendix appears
normal. There is no evidence of bowel obstruction. Small fat
containing periumbilical hernia is noted. Atherosclerotic
calcifications abdominal aorta are noted without aneurysm formation.
No abnormal fluid collection is noted. Urinary bladder appears
normal. Status post hysterectomy. No significant adenopathy is
noted.
IMPRESSION: Partial fatty replacement of the pancreas.

Cholelithiasis.

Small fat containing periumbilical hernia.

No hydronephrosis or renal obstruction is noted. No renal or
ureteral calculi are noted.

## 2016-01-08 ENCOUNTER — Other Ambulatory Visit: Payer: Self-pay | Admitting: Internal Medicine

## 2016-01-08 NOTE — Telephone Encounter (Signed)
Rx refill sent to pharmacy. 

## 2016-01-29 DIAGNOSIS — F3342 Major depressive disorder, recurrent, in full remission: Secondary | ICD-10-CM | POA: Diagnosis not present

## 2016-03-11 ENCOUNTER — Ambulatory Visit (INDEPENDENT_AMBULATORY_CARE_PROVIDER_SITE_OTHER): Payer: Medicare Other | Admitting: Internal Medicine

## 2016-03-11 ENCOUNTER — Encounter: Payer: Self-pay | Admitting: Internal Medicine

## 2016-03-11 VITALS — BP 138/76 | HR 59 | Ht 65.0 in | Wt 203.0 lb

## 2016-03-11 DIAGNOSIS — M545 Low back pain, unspecified: Secondary | ICD-10-CM

## 2016-03-11 MED ORDER — TRAMADOL HCL 50 MG PO TABS: 50.0000 mg | ORAL_TABLET | Freq: Three times a day (TID) | ORAL | Status: DC | PRN

## 2016-03-11 MED ORDER — PREDNISONE 20 MG PO TABS: 20.0000 mg | ORAL_TABLET | Freq: Every day | ORAL | Status: DC

## 2016-03-11 NOTE — Progress Notes (Signed)
Pre visit review using our clinic review tool, if applicable. No additional management support is needed unless otherwise documented below in the visit note. 

## 2016-03-11 NOTE — Patient Instructions (Signed)
Start Prednisone 20mg  daily.  Use Tramadol 50-100mg  every 8 hours as needed for severe pain.

## 2016-03-11 NOTE — Assessment & Plan Note (Signed)
Recurrent severe lower back pain. Will start Prednisone short burst and use prn Tramadol for severe pain, as this has worked well for her in the past. She also has follow up with Dr. Sharlet Salina. Follow up if symptoms are not improving.

## 2016-03-11 NOTE — Progress Notes (Signed)
Subjective:    Patient ID: Isabella Hurst, female    DOB: 08-29-1946, 70 y.o.   MRN: [id number]  HPI  70YO female presents for acute visit.  Low back pain - Starting yesterday. Severe. Lower back. Does not generally radiate. Spasm with any movement. Took Ibuprofen this morning with minimal improvement. Similar to previous episodes. Was lifting some water bottles Saturday, but not sure if this triggered it.   Wt Readings from Last 3 Encounters:  03/11/16 203 lb (92.08 kg)  11/13/15 190 lb 8 oz (86.41 kg)  11/01/15 201 lb 9.6 oz (91.445 kg)   BP Readings from Last 3 Encounters:  03/11/16 138/76  11/13/15 124/72  11/01/15 122/76    Past Medical History  Diagnosis Date  . Hypertension   . GERD (gastroesophageal reflux disease)   . Colon polyps   . Depression     Dr. Lyndle Herrlich from [place]  . Phlebitis   . Complication of anesthesia     c-section - saw fast moving, flashing lights through sedation.  NO issues since  . Arthritis     feet  . Bulging lumbar disc     also, herniated disc  . Varicose vein   . Rosacea    Family History  Problem Relation Age of Onset  . Heart disease Mother   . Cancer Father     Lung cancer   Past Surgical History  Procedure Laterality Date  . Cesarean section      2  . Gastric bypass  1980  . Hernia repair  1995    incisional hernia  . Abdominal hysterectomy  1994    complete for fibroids  . Cataract extraction w/phaco Left 07/04/2015    Procedure: CATARACT EXTRACTION PHACO AND INTRAOCULAR LENS PLACEMENT (IOC);  Surgeon: Leandrew Koyanagi, MD;  Location: [facility];  Service: Ophthalmology;  Laterality: Left;  . Cataract extraction w/phaco Right 08/01/2015    Procedure: CATARACT EXTRACTION PHACO AND INTRAOCULAR LENS PLACEMENT (IOC);  Surgeon: Leandrew Koyanagi, MD;  Location: [facility];  Service: Ophthalmology;  Laterality: Right;   Social History   Social History  . Marital Status: Divorced    Spouse Name:  N/A  . Number of Children: N/A  . Years of Education: N/A   Social History Main Topics  . Smoking status: Never Smoker   . Smokeless tobacco: Never Used  . Alcohol Use: No     Comment: wine, 3-4 times /year  . Drug Use: No  . Sexual Activity: Not on file   Other Topics Concern  . Not on file   Social History Narrative   Lives in [place]. Brother lives with her. From [place]. Widow, then divorced. 2 daughters, on deceased.      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      Diet - regular diet, no desserts, previously gluten free      Exercise - started walking program, daily    Review of Systems  Constitutional: Negative for fever, chills, appetite change, fatigue and unexpected weight change.  Eyes: Negative for visual disturbance.  Respiratory: Negative for shortness of breath.   Cardiovascular: Negative for chest pain and leg swelling.  Gastrointestinal: Negative for nausea, vomiting, abdominal pain, diarrhea and constipation.  Musculoskeletal: Positive for myalgias, back pain and arthralgias.  Skin: Negative for color change and rash.  Hematological: Negative for adenopathy. Does not bruise/bleed easily.  Psychiatric/Behavioral: Positive for sleep disturbance. Negative for dysphoric mood. The patient is not nervous/anxious.  Objective:    BP 138/76 mmHg  Pulse 59  Ht 5\' 5"  (1.651 m)  Wt 203 lb (92.08 kg)  BMI 33.78 kg/m2  SpO2 99% Physical Exam  Constitutional: She is oriented to person, place, and time. She appears well-developed and well-nourished. No distress.  HENT:  Head: Normocephalic and atraumatic.  Right Ear: External ear normal.  Left Ear: External ear normal.  Nose: Nose normal.  Eyes: Conjunctivae are normal. Pupils are equal, round, and reactive to light. Right eye exhibits no discharge. Left eye exhibits no discharge. No scleral icterus.  Neck: Normal range of motion. Neck supple. No tracheal deviation present. No thyromegaly present.    Cardiovascular: Normal rate, regular rhythm, normal heart sounds and intact distal pulses.  Exam reveals no gallop and no friction rub.   No murmur heard. Pulmonary/Chest: Effort normal and breath sounds normal. No respiratory distress. She has no wheezes. She has no rales. She exhibits no tenderness.  Musculoskeletal: She exhibits no edema or tenderness.       Lumbar back: She exhibits decreased range of motion and pain.  Lymphadenopathy:    She has no cervical adenopathy.  Neurological: She is alert and oriented to person, place, and time. No cranial nerve deficit. She exhibits normal muscle tone. Coordination normal.  Skin: Skin is warm and dry. No rash noted. She is not diaphoretic. No erythema. No pallor.  Psychiatric: She has a normal mood and affect. Her behavior is normal. Judgment and thought content normal.          Assessment & Plan:   Problem List Items Addressed This Visit      Unprioritized   LBP (low back pain) - Primary    Recurrent severe lower back pain. Will start Prednisone short burst and use prn Tramadol for severe pain, as this has worked well for her in the past. She also has follow up with Dr. Sharlet Salina. Follow up if symptoms are not improving.      Relevant Medications   predniSONE (DELTASONE) 20 MG tablet   traMADol (ULTRAM) 50 MG tablet       Return if symptoms worsen or fail to improve.  Ronette Deter, MD Internal Medicine [facility]

## 2016-03-12 ENCOUNTER — Telehealth: Payer: Self-pay | Admitting: Internal Medicine

## 2016-03-12 NOTE — Telephone Encounter (Signed)
Spoke with patient.  She took 2 tabs tramadol last night at 9 pm.  She woke up in the middle of the night itching.  This morning she took 1 tramadol at 6:30 am.  The itching has increased but no rash.  The itching is now waist up and now her lips are itching but no SOB or swelling.  Advised patient not take anymore Tramadol.  She is still having pain.  Please advise and would you like Tramadol added to the her allergie list?

## 2016-03-12 NOTE — Telephone Encounter (Signed)
Stop Tramadol. If lips are swelling needs to go to ER now. Use Tylenol for pain. She was also given a prednisone taper which should help. If any worsening rash, lip swelling, dyspnea over next few hours needs to be seen in ER ASAP. Start Benadryl 50mg  po q6hr as needed.

## 2016-03-12 NOTE — Telephone Encounter (Signed)
Spoke to patient, patient was just feeling itchy all over body but it started on her lips. No swelling.  Instructed patient to quit taking the tramadol.  Patient will now be taking Tylenol for px, for the itching she will be taking benadryl 50mg  po q6hr as needed.  Told patient that If any worsening rash, lip swelling, dyspnea over next few hours needs to be seen in ER. Patients understands and has no questions or comments or concerns.

## 2016-03-12 NOTE — Telephone Encounter (Signed)
Pt saw Dr. Gilford Rile yesterday for back pain. Back pain is still there. Today she is itchy all over, no rash. Pt believes it is from the traMADol (ULTRAM) 50 MG tablet, but not sure. Please call pt at home.

## 2016-03-13 ENCOUNTER — Telehealth: Payer: Self-pay | Admitting: Internal Medicine

## 2016-03-13 NOTE — Telephone Encounter (Signed)
We can try a higher dose Prednisone taper. I will call this in.  If this does not work, and pain is severe, she may be IV pain medication, which would need to be through the ER.

## 2016-03-13 NOTE — Telephone Encounter (Signed)
What dosing are you calling in? Thanks

## 2016-03-13 NOTE — Telephone Encounter (Signed)
Phone call from the answering service, apparently the pharmacy did not receive a order for prednisone. Chart reviewed, plan was to send at 60 mg taper. Verbal order: Prednisone 10 mg: 61, 51, 41, 31, 21, 11, #21, no refill. Will notify PCP

## 2016-03-13 NOTE — Telephone Encounter (Signed)
Prednisone starting at 60mg  daily then tapering by 10mg  daily until gone

## 2016-03-13 NOTE — Telephone Encounter (Signed)
Benadryl made the itching go completely away.   Tylenol not working for pain Back were she started or maybe worse. Top of the sciatica nerve and down left leg, leaning to the right a bit so that pain isn't worse.  Up the steroid maybe, had a good response to an increases last year with just the steroid increasing to maybe help the pain.  Currently only has two 20mg  tablets left.   Stopped the tramadol yesterday as advise.   Just doesn't know what to do for pain today?

## 2016-03-13 NOTE — Telephone Encounter (Signed)
Pt was seen the beginning of this week by Dr. Gilford Rile. She has had some problems with her back. Called yesterday with itching no rash do to medication. Today pt is in severe back pain. Please call pt at home.

## 2016-03-13 NOTE — Telephone Encounter (Signed)
Spoke with patient.  Verbalized understanding, thanks

## 2016-05-01 ENCOUNTER — Ambulatory Visit: Payer: Medicare Other | Admitting: Family Medicine

## 2016-05-19 DIAGNOSIS — H401432 Capsular glaucoma with pseudoexfoliation of lens, bilateral, moderate stage: Secondary | ICD-10-CM | POA: Diagnosis not present

## 2016-05-19 DIAGNOSIS — H401492 Capsular glaucoma with pseudoexfoliation of lens, unspecified eye, moderate stage: Secondary | ICD-10-CM | POA: Diagnosis not present

## 2016-05-22 ENCOUNTER — Ambulatory Visit: Payer: Medicare Other | Admitting: Family Medicine

## 2016-05-22 DIAGNOSIS — F3342 Major depressive disorder, recurrent, in full remission: Secondary | ICD-10-CM | POA: Diagnosis not present

## 2016-05-23 ENCOUNTER — Other Ambulatory Visit
Admission: RE | Admit: 2016-05-23 | Discharge: 2016-05-23 | Disposition: A | Discharge status: Home / Self Care | Payer: Medicare Other | Source: Ambulatory Visit | Attending: Physician Assistant | Admitting: Physician Assistant

## 2016-05-23 ENCOUNTER — Other Ambulatory Visit: Payer: Self-pay | Admitting: Nurse Practitioner

## 2016-05-23 DIAGNOSIS — R2681 Unsteadiness on feet: Secondary | ICD-10-CM

## 2016-05-23 DIAGNOSIS — R5383 Other fatigue: Secondary | ICD-10-CM | POA: Insufficient documentation

## 2016-05-23 DIAGNOSIS — Z9884 Bariatric surgery status: Secondary | ICD-10-CM | POA: Diagnosis not present

## 2016-05-23 DIAGNOSIS — M6281 Muscle weakness (generalized): Secondary | ICD-10-CM | POA: Diagnosis not present

## 2016-05-23 DIAGNOSIS — R269 Unspecified abnormalities of gait and mobility: Secondary | ICD-10-CM | POA: Insufficient documentation

## 2016-05-23 LAB — CBC
HCT: 48.7 % — ABNORMAL HIGH (ref 35.0–47.0)
Hemoglobin: 17 g/dL — ABNORMAL HIGH (ref 12.0–16.0)
MCH: 34.3 pg — AB (ref 26.0–34.0)
MCHC: 35 g/dL (ref 32.0–36.0)
MCV: 98 fL (ref 80.0–100.0)
PLATELETS: 235 10*3/uL (ref 150–440)
RBC: 4.96 MIL/uL (ref 3.80–5.20)
RDW: 13 % (ref 11.5–14.5)
WBC: 6.4 10*3/uL (ref 3.6–11.0)

## 2016-05-23 LAB — COMPREHENSIVE METABOLIC PANEL
ALBUMIN: 4.2 g/dL (ref 3.5–5.0)
ALT: 17 U/L (ref 14–54)
ANION GAP: 10 (ref 5–15)
AST: 25 U/L (ref 15–41)
Alkaline Phosphatase: 64 U/L (ref 38–126)
BUN: 22 mg/dL — ABNORMAL HIGH (ref 6–20)
CO2: 26 mmol/L (ref 22–32)
Calcium: 9.1 mg/dL (ref 8.9–10.3)
Chloride: 102 mmol/L (ref 101–111)
Creatinine, Ser: 0.53 mg/dL (ref 0.44–1.00)
GFR calc Af Amer: >60 mL/min (ref >60)
GFR calc non Af Amer: >60 mL/min (ref >60)
GLUCOSE: 125 mg/dL — AB (ref 65–99)
POTASSIUM: 3.7 mmol/L (ref 3.5–5.1)
SODIUM: 138 mmol/L (ref 135–145)
Total Bilirubin: 0.9 mg/dL (ref 0.3–1.2)
Total Protein: 7.1 g/dL (ref 6.5–8.1)

## 2016-05-23 LAB — IRON AND TIBC
IRON: 104 ug/dL (ref 28–170)
Saturation Ratios: 29 % (ref 10.4–31.8)
TIBC: 362 ug/dL (ref 250–450)
UIBC: 258 ug/dL

## 2016-05-23 LAB — SEDIMENTATION RATE: SED RATE: 3 mm/h (ref 0–30)

## 2016-05-23 LAB — TSH: TSH: 2.85 u[IU]/mL (ref 0.350–4.500)

## 2016-05-23 LAB — T4, FREE: Free T4: 0.94 ng/dL (ref 0.61–1.12)

## 2016-05-23 LAB — VITAMIN B12: Vitamin B-12: 319 pg/mL (ref 180–914)

## 2016-05-23 LAB — FERRITIN: Ferritin: 108 ng/mL (ref 11–307)

## 2016-05-24 LAB — VITAMIN D 25 HYDROXY (VIT D DEFICIENCY, FRACTURES): Vit D, 25-Hydroxy: 32.4 ng/mL (ref 30.0–100.0)

## 2016-05-24 LAB — ANA W/REFLEX: ANA: NEGATIVE

## 2016-05-24 LAB — RHEUMATOID FACTOR

## 2016-05-26 DIAGNOSIS — H401492 Capsular glaucoma with pseudoexfoliation of lens, unspecified eye, moderate stage: Secondary | ICD-10-CM | POA: Diagnosis not present

## 2016-05-26 LAB — VITAMIN B1: VITAMIN B1 (THIAMINE): 141.4 nmol/L (ref 66.5–200.0)

## 2016-05-26 LAB — FOLATE: Folate: 10.2 ng/mL (ref >5.9)

## 2016-06-03 DIAGNOSIS — F3342 Major depressive disorder, recurrent, in full remission: Secondary | ICD-10-CM | POA: Diagnosis not present

## 2016-06-04 ENCOUNTER — Ambulatory Visit
Admission: RE | Admit: 2016-06-04 | Discharge: 2016-06-04 | Disposition: A | Discharge status: Home / Self Care | Payer: Medicare Other | Source: Ambulatory Visit | Attending: Nurse Practitioner | Admitting: Nurse Practitioner

## 2016-06-04 DIAGNOSIS — I6523 Occlusion and stenosis of bilateral carotid arteries: Secondary | ICD-10-CM | POA: Diagnosis not present

## 2016-06-04 DIAGNOSIS — Z8489 Family history of other specified conditions: Secondary | ICD-10-CM | POA: Insufficient documentation

## 2016-06-04 DIAGNOSIS — R2681 Unsteadiness on feet: Secondary | ICD-10-CM | POA: Insufficient documentation

## 2016-06-11 DIAGNOSIS — R011 Cardiac murmur, unspecified: Secondary | ICD-10-CM | POA: Diagnosis not present

## 2016-06-11 DIAGNOSIS — R001 Bradycardia, unspecified: Secondary | ICD-10-CM | POA: Diagnosis not present

## 2016-06-23 ENCOUNTER — Ambulatory Visit: Payer: Medicare Other | Admitting: Family

## 2016-06-24 DIAGNOSIS — G259 Extrapyramidal and movement disorder, unspecified: Secondary | ICD-10-CM | POA: Diagnosis not present

## 2016-06-24 DIAGNOSIS — F331 Major depressive disorder, recurrent, moderate: Secondary | ICD-10-CM | POA: Diagnosis not present

## 2016-06-24 DIAGNOSIS — I10 Essential (primary) hypertension: Secondary | ICD-10-CM | POA: Diagnosis not present

## 2016-06-30 ENCOUNTER — Encounter: Payer: Self-pay | Admitting: Family

## 2016-06-30 DIAGNOSIS — F429 Obsessive-compulsive disorder, unspecified: Secondary | ICD-10-CM | POA: Insufficient documentation

## 2016-07-08 ENCOUNTER — Other Ambulatory Visit: Payer: Self-pay

## 2016-07-08 MED ORDER — HYDROCHLOROTHIAZIDE 25 MG PO TABS: ORAL_TABLET | ORAL | 2 refills | Status: DC

## 2016-07-08 NOTE — Telephone Encounter (Signed)
Medication has been refilled.

## 2016-07-24 ENCOUNTER — Other Ambulatory Visit: Payer: Self-pay | Admitting: Nurse Practitioner

## 2016-07-24 DIAGNOSIS — I6523 Occlusion and stenosis of bilateral carotid arteries: Secondary | ICD-10-CM

## 2016-08-01 ENCOUNTER — Other Ambulatory Visit: Payer: Self-pay | Admitting: Nurse Practitioner

## 2016-08-01 ENCOUNTER — Ambulatory Visit
Admission: RE | Admit: 2016-08-01 | Discharge: 2016-08-01 | Disposition: A | Discharge status: Home / Self Care | Payer: Medicare Other | Source: Ambulatory Visit | Attending: Nurse Practitioner | Admitting: Nurse Practitioner

## 2016-08-01 DIAGNOSIS — I6523 Occlusion and stenosis of bilateral carotid arteries: Secondary | ICD-10-CM

## 2016-08-01 DIAGNOSIS — R2681 Unsteadiness on feet: Secondary | ICD-10-CM | POA: Diagnosis not present

## 2016-08-01 MED ORDER — IOPAMIDOL (ISOVUE-370) INJECTION 76%
75.0000 mL | Freq: Once | INTRAVENOUS | Status: AC | PRN
  Administered 2016-08-01: 75 mL via INTRAVENOUS

## 2016-08-05 LAB — POCT I-STAT CREATININE: Creatinine, Ser: 0.7 mg/dL (ref 0.44–1.00)

## 2016-08-25 DIAGNOSIS — M6281 Muscle weakness (generalized): Secondary | ICD-10-CM | POA: Diagnosis not present

## 2016-08-25 DIAGNOSIS — M79605 Pain in left leg: Secondary | ICD-10-CM | POA: Diagnosis not present

## 2016-08-25 DIAGNOSIS — M79604 Pain in right leg: Secondary | ICD-10-CM | POA: Diagnosis not present

## 2016-08-26 ENCOUNTER — Other Ambulatory Visit: Payer: Self-pay | Admitting: Neurology

## 2016-08-26 DIAGNOSIS — M79605 Pain in left leg: Principal | ICD-10-CM

## 2016-08-26 DIAGNOSIS — M79604 Pain in right leg: Secondary | ICD-10-CM

## 2016-09-01 ENCOUNTER — Ambulatory Visit
Admission: RE | Admit: 2016-09-01 | Discharge: 2016-09-01 | Disposition: A | Discharge status: Home / Self Care | Payer: Medicare Other | Source: Ambulatory Visit | Attending: Neurology | Admitting: Neurology

## 2016-09-01 DIAGNOSIS — M79605 Pain in left leg: Secondary | ICD-10-CM | POA: Insufficient documentation

## 2016-09-01 DIAGNOSIS — M79604 Pain in right leg: Secondary | ICD-10-CM | POA: Insufficient documentation

## 2016-09-16 ENCOUNTER — Ambulatory Visit: Payer: Medicare Other | Attending: Neurology

## 2016-09-16 DIAGNOSIS — R262 Difficulty in walking, not elsewhere classified: Secondary | ICD-10-CM | POA: Insufficient documentation

## 2016-09-16 DIAGNOSIS — R278 Other lack of coordination: Secondary | ICD-10-CM | POA: Insufficient documentation

## 2016-09-16 DIAGNOSIS — M6281 Muscle weakness (generalized): Secondary | ICD-10-CM | POA: Diagnosis not present

## 2016-09-17 NOTE — Therapy (Signed)
[place] MAIN [facility] SERVICES [address] [place], [place], [address] Phone: [phone]   Fax:  [phone]  Physical Therapy Evaluation   Patient Details  Name: Isabella Hurst Current MRN: [id number] Date of Birth: [birth year] Referring Provider: Gurney Maxin MD  Encounter Date: 09/16/2016      PT End of Session - 09/17/16 1053    Visit Number 1   Number of Visits 16   Date for PT Re-Evaluation 11/11/16   Authorization Type 1/10 G Code   PT Start Time 1100   PT Stop Time 1200   PT Time Calculation (min) 60 min   Equipment Utilized During Treatment Gait belt   Activity Tolerance Patient tolerated treatment well   Behavior During Therapy [facility] for tasks assessed/performed      Past Medical History:  Diagnosis Date  . Arthritis    feet  . Bulging lumbar disc    also, herniated disc  . Colon polyps   . Complication of anesthesia    c-section - saw fast moving, flashing lights through sedation.  NO issues since  . Depression    Dr. Lyndle Herrlich from [place]  . GERD (gastroesophageal reflux disease)   . Hypertension   . Phlebitis   . Rosacea   . Varicose vein     Past Surgical History:  Procedure Laterality Date  . ABDOMINAL HYSTERECTOMY  1994   complete for fibroids  . CATARACT EXTRACTION W/PHACO Left 07/04/2015   Procedure: CATARACT EXTRACTION PHACO AND INTRAOCULAR LENS PLACEMENT (IOC);  Surgeon: Leandrew Koyanagi, MD;  Location: [place];  Service: Ophthalmology;  Laterality: Left;  . CATARACT EXTRACTION W/PHACO Right 08/01/2015   Procedure: CATARACT EXTRACTION PHACO AND INTRAOCULAR LENS PLACEMENT (IOC);  Surgeon: Leandrew Koyanagi, MD;  Location: [place];  Service: Ophthalmology;  Laterality: Right;  . CESAREAN SECTION     2  . GASTRIC BYPASS  1980  . HERNIA REPAIR  1995   incisional hernia    There were no vitals filed for this visit.      Subjective Assessment - 09/16/16 1120    Subjective  Patient reports difficulty with ascending/descending the stairs, squatting, ambulation, standing on one leg, going up curbs, cooking (cannot reach down to reach down), transfering in and out of the shower, transfering from sitting to standing. Patient reports she would like grab bars in the shower.    Pertinent History Hx of LBP (~ 2xyear for past 10 years)   How long can you stand comfortably? 66min until increase in back   How long can you walk comfortably? .2 miles   Patient Stated Goals To walk up the stairs, and transfer from sitting to standing   Currently in Pain? No/denies            [facility] PT Assessment - 09/16/16 1109      Assessment   Medical Diagnosis Leg Weakness, difficulty with walking   Referring Provider Gurney Maxin MD   Onset Date/Surgical Date 09/29/14   Hand Dominance Right   Next MD Visit 10/06/16   Prior Therapy none     Precautions   Precautions Fall     Balance Screen   Has the patient fallen in the past 6 months No   Has the patient had a decrease in activity level because of a fear of falling?  Yes   Is the patient reluctant to leave their home because of a fear of falling?  Yes     Home Environment  Living Environment Private residence   Living Arrangements Other (Comment)  brother   Available Help at Discharge Family   Type of [place] Access Level entry   [place] One level     Prior Function   Level of Independence Independent   Vocation Retired   Biomedical scientist N/A   [place] games, reading, crocheting, and watching TV     Cognition   Overall Cognitive Status Within Functional Limits for tasks assessed     Standardized Balance Assessment   Standardized Balance Assessment 10 meter walk test;Five Times Sit to [employer] Test;Timed Up and Go Test   Five times sit to stand comments  19sec   10 Meter Walk 1.05 m/s     [place] Balance Test   Sit to Stand Able to stand  independently using hands   Standing  Unsupported Able to stand safely 2 minutes   Sitting with Back Unsupported but Feet Supported on Floor or Stool Able to sit safely and securely 2 minutes   Stand to Sit Controls descent by using hands   Transfers Able to transfer safely, definite need of hands   Standing Unsupported with Eyes Closed Able to stand 10 seconds safely   Standing Ubsupported with Feet Together Able to place feet together independently and stand 1 minute safely   From Standing, Reach Forward with Outstretched Arm Can reach forward >5 cm safely (2")   From Standing Position, Pick up Object from Floor Able to pick up shoe safely and easily   From Standing Position, Turn to Look Behind Over each Shoulder Looks behind from both sides and weight shifts well   Turn 360 Degrees Able to turn 360 degrees safely one side only in 4 seconds or less   Standing Unsupported, Alternately Place Feet on Step/Stool Able to stand independently and complete 8 steps >20 seconds   Standing Unsupported, One Foot in Front Able to take small step independently and hold 30 seconds   Standing on One Leg Tries to lift leg/unable to hold 3 seconds but remains standing independently   Total Score 44     Timed Up and Go Test   Normal TUG (seconds) 12.5      Observation:  Gait: Decreased step length bilaterally, decreased gait speed when ambulating Stairs: Step to gait pattern; able to perform with unilateral arm support but unable to perform with UE secondary to fear; Able to perform 1 step without UE support.  TREATMENT: Sit to stands -- 2 x 10 Seated hip adduction with glute squeeze -- 2 x 10 Seated hip abduction with GTB -- 2 x 10         PT Education - 09/17/16 1045    Education provided Yes   Education Details HEP: sit to stands, seated hip abduction, hip adduction   Person(s) Educated Patient   Methods Explanation;Demonstration   Comprehension Verbalized understanding;Returned demonstration             PT Long Term  Goals - 09/17/16 1142      PT LONG TERM GOAL #1   Title Patient will improve BERG balance by 8 points to demosntrate significant improvement in fall risk and abiilty to bend down and lift itrems from the floors   Baseline BERG: 44    Time 8   Period Weeks   Status New     PT LONG TERM GOAL #2   Title Patient will improve 5xSTS score to under 16sec to demonstrate decrease  fall risk and greater ability to stand up to perform ADLs.   Baseline 5xSTS: 19sec   Time 8   Period Weeks   Status New     PT LONG TERM GOAL #3   Title Patient will improve LEFS score by 12 points to demonstrate singificant improvement in self perceived LE function and greater ability to perfrom stairs.   Baseline LEFS: 24   Time 8   Period Weeks   Status New               Plan - 23-Sep-2016 1100    Clinical Impression Statement Pt is a 26 R hand dominant female who presents with increased fall risk, difficulty balancing, and transferring from sitting to standing. Patient's fall risk and difficulty with mobility are indicated by decreased LEFS score,  5xSTS, and BERG balance scores. Patient also demonstrates decreased muscular strength, endurance, and coordination with movement and will benefit from further skilled therapy to return to prior level of function.    Rehab Potential Fair   Clinical Impairments Affecting Rehab Potential (+) family support, highly motivated, (-) age, chronicity of condition   PT Frequency 2x / week   PT Duration 8 weeks   PT Treatment/Interventions Therapeutic activities;Therapeutic exercise;Balance training;Stair training;Gait training;Neuromuscular re-education;Patient/family education;Manual techniques   PT Next Visit Plan Progress strengthening and balance exercise   PT Home Exercise Plan Sit to stands, Seated hip abduction, seated adduction   Consulted and Agree with Plan of Care Patient      Patient will benefit from skilled therapeutic intervention in order to improve the  following deficits and impairments:  Abnormal gait, Decreased mobility, Decreased coordination, Increased muscle spasms, Decreased endurance, Decreased strength, Decreased activity tolerance, Decreased balance, Difficulty walking  Visit Diagnosis: Difficulty in walking, not elsewhere classified  Muscle weakness (generalized)  Other lack of coordination       G-Codes - 09-23-2016 1154    Functional Assessment Tool Used 5xSTS, BERG, LEFS, clinical judgement   Functional Limitation Mobility: Walking and moving around   Mobility: Walking and Moving Around Current Status [id number]) At least 20 percent but less than 40 percent impaired, limited or restricted   Mobility: Walking and Moving Around Goal Status [phone]) At least 1 percent but less than 20 percent impaired, limited or restricted      Problem List Patient Active Problem List   Diagnosis Date Noted  . OCD (obsessive compulsive disorder) 06/30/2016  . TMJ tenderness 11/13/2015  . LBP (low back pain) 02/12/2015  . Medicare annual wellness visit, subsequent 08/15/2014  . Allergic rhinitis 01/31/2014  . Routine general medical examination at a health care facility 08/10/2013  . Depression 06/09/2013  . Screening for breast cancer 06/09/2013  . Essential hypertension, benign 06/09/2013  . Other and unspecified hyperlipidemia 06/09/2013  . Rosacea 06/09/2013  . Insomnia 06/09/2013  . Alcohol dependence ([place]) 06/09/2013    Blythe Stanford, PT DPT 09-23-16, 11:55 AM  [place] MAIN [facility] SERVICES [address] [place], [place], [address] Phone: [phone]   Fax:  [phone]  Name: Karishma Endris MRN: [id number] Date of Birth: [birth year]

## 2016-09-18 ENCOUNTER — Ambulatory Visit: Payer: Medicare Other

## 2016-09-18 DIAGNOSIS — R262 Difficulty in walking, not elsewhere classified: Secondary | ICD-10-CM

## 2016-09-18 DIAGNOSIS — R278 Other lack of coordination: Secondary | ICD-10-CM

## 2016-09-18 DIAGNOSIS — M6281 Muscle weakness (generalized): Secondary | ICD-10-CM | POA: Diagnosis not present

## 2016-09-18 NOTE — Therapy (Signed)
[place] MAIN [facility] SERVICES [address] [place], [place], [address] Phone: [phone]   Fax:  [phone]  Physical Therapy Treatment  Patient Details  Name: Isabella Hurst MRN: [id number] Date of Birth: [birth year] Referring Provider: Gurney Maxin MD  Encounter Date: 09/18/2016      PT End of Session - 09/18/16 1043    Visit Number 2   Number of Visits 16   Date for PT Re-Evaluation 11/11/16   Authorization Type 2/10 G Code   PT Start Time 1030   PT Stop Time 1115   PT Time Calculation (min) 45 min   Equipment Utilized During Treatment Gait belt   Activity Tolerance Patient tolerated treatment well   Behavior During Therapy [facility] for tasks assessed/performed      Past Medical History:  Diagnosis Date  . Arthritis    feet  . Bulging lumbar disc    also, herniated disc  . Colon polyps   . Complication of anesthesia    c-section - saw fast moving, flashing lights through sedation.  NO issues since  . Depression    Dr. Lyndle Herrlich from [place]  . GERD (gastroesophageal reflux disease)   . Hypertension   . Phlebitis   . Rosacea   . Varicose vein     Past Surgical History:  Procedure Laterality Date  . ABDOMINAL HYSTERECTOMY  1994   complete for fibroids  . CATARACT EXTRACTION W/PHACO Left 07/04/2015   Procedure: CATARACT EXTRACTION PHACO AND INTRAOCULAR LENS PLACEMENT (IOC);  Surgeon: Leandrew Koyanagi, MD;  Location: [place];  Service: Ophthalmology;  Laterality: Left;  . CATARACT EXTRACTION W/PHACO Right 08/01/2015   Procedure: CATARACT EXTRACTION PHACO AND INTRAOCULAR LENS PLACEMENT (IOC);  Surgeon: Leandrew Koyanagi, MD;  Location: [place];  Service: Ophthalmology;  Laterality: Right;  . CESAREAN SECTION     2  . GASTRIC BYPASS  1980  . HERNIA REPAIR  1995   incisional hernia    There were no vitals filed for this visit.      Subjective Assessment - 09/18/16 1036    Subjective Patient  reports she's having difficulty with arrising from the commode.    Pertinent History Hx of LBP (~ 2xyear for past 10 years)   How long can you stand comfortably? 58min until increase in back   How long can you walk comfortably? .2 miles   Patient Stated Goals To walk up the stairs, and transfer from sitting to standing      TREATMENT: Sit to stand with arms across chest - 2 x 10  Seated hip adduction against ball with glute squeeze - 2 x 15 Seated hip abduction with GTB - 2 x 15  Seated [place] in sitting with GTB - 2 x 10  Side stepping up and over airex - x10 Forward stepping onto airex - x10 Leg Press at [place] - 2 x 15 75#       PT Education - 09/18/16 1043    Education provided Yes   Education Details Form and technique throughout exercise session   Person(s) Educated Patient   Methods Explanation;Demonstration   Comprehension Verbalized understanding;Returned demonstration             PT Long Term Goals - 09/17/16 1142      PT LONG TERM GOAL #1   Title Patient will improve BERG balance by 8 points to demosntrate significant improvement in fall risk and abiilty to bend down and lift itrems from the floors  Baseline BERG: 44    Time 8   Period Weeks   Status New     PT LONG TERM GOAL #2   Title Patient will improve 5xSTS score to under 16sec to demonstrate decrease fall risk and greater ability to stand up to perform ADLs.   Baseline 5xSTS: 19sec   Time 8   Period Weeks   Status New     PT LONG TERM GOAL #3   Title Patient will improve LEFS score by 12 points to demonstrate singificant improvement in self perceived LE function and greater ability to perfrom stairs.   Baseline LEFS: 24   Time 8   Period Weeks   Status New               Plan - 09/18/16 1054    Clinical Impression Statement Pt demonstrates increased fasiculations and fatigue when performing exercises indicating decreased muscular endurance and strength. Patient demonstrates improved  ability to perform sit to stands and patient will benefit from further skilled therapy to return to prior level of function.    Rehab Potential Fair   Clinical Impairments Affecting Rehab Potential (+) family support, highly motivated, (-) age, chronicity of condition   PT Frequency 2x / week   PT Duration 8 weeks   PT Treatment/Interventions Therapeutic activities;Therapeutic exercise;Balance training;Stair training;Gait training;Neuromuscular re-education;Patient/family education;Manual techniques   PT Next Visit Plan Progress strengthening and balance exercise   PT Home Exercise Plan Sit to stands, Seated hip abduction, seated adduction   Consulted and Agree with Plan of Care Patient      Patient will benefit from skilled therapeutic intervention in order to improve the following deficits and impairments:  Abnormal gait, Decreased mobility, Decreased coordination, Increased muscle spasms, Decreased endurance, Decreased strength, Decreased activity tolerance, Decreased balance, Difficulty walking  Visit Diagnosis: Difficulty in walking, not elsewhere classified  Muscle weakness (generalized)  Other lack of coordination       G-Codes - 10-05-2016 1154    Functional Assessment Tool Used 5xSTS, BERG, LEFS, clinical judgement   Functional Limitation Mobility: Walking and moving around   Mobility: Walking and Moving Around Current Status [id number]) At least 20 percent but less than 40 percent impaired, limited or restricted   Mobility: Walking and Moving Around Goal Status [phone]) At least 1 percent but less than 20 percent impaired, limited or restricted      Problem List Patient Active Problem List   Diagnosis Date Noted  . OCD (obsessive compulsive disorder) 06/30/2016  . TMJ tenderness 11/13/2015  . LBP (low back pain) 02/12/2015  . Medicare annual wellness visit, subsequent 08/15/2014  . Allergic rhinitis 01/31/2014  . Routine general medical examination at a health care facility  08/10/2013  . Depression 06/09/2013  . Screening for breast cancer 06/09/2013  . Essential hypertension, benign 06/09/2013  . Other and unspecified hyperlipidemia 06/09/2013  . Rosacea 06/09/2013  . Insomnia 06/09/2013  . Alcohol dependence ([place]) 06/09/2013    Blythe Stanford, PT DPT 09/18/2016, 11:13 AM  [place] MAIN [facility] SERVICES [address] [place], [place], [address] Phone: [phone]   Fax:  [phone]  Name: Isabella Hurst MRN: [id number] Date of Birth: 1946-08-29

## 2016-09-24 ENCOUNTER — Ambulatory Visit: Payer: Medicare Other

## 2016-09-30 ENCOUNTER — Ambulatory Visit: Payer: Medicare Other | Attending: Neurology

## 2016-09-30 DIAGNOSIS — R278 Other lack of coordination: Secondary | ICD-10-CM | POA: Diagnosis not present

## 2016-09-30 DIAGNOSIS — R262 Difficulty in walking, not elsewhere classified: Secondary | ICD-10-CM | POA: Diagnosis not present

## 2016-09-30 DIAGNOSIS — M6281 Muscle weakness (generalized): Secondary | ICD-10-CM | POA: Insufficient documentation

## 2016-09-30 NOTE — Therapy (Signed)
[place] MAIN [facility] SERVICES [address] [place], [place], [address] Phone: [phone]   Fax:  [phone]  Physical Therapy Treatment  Patient Details  Name: Isabella Hurst MRN: [id number] Date of Birth: 09/18/1946 Referring Provider: Gurney Maxin MD  Encounter Date: 09/30/2016      PT End of Session - 09/30/16 1608    Visit Number 3   Number of Visits 16   Date for PT Re-Evaluation 11/11/16   Authorization Type 3/10 G Code   PT Start Time [id number]   PT Stop Time 1615   PT Time Calculation (min) 45 min   Equipment Utilized During Treatment Gait belt   Activity Tolerance Patient tolerated treatment well   Behavior During Therapy [facility] for tasks assessed/performed      Past Medical History:  Diagnosis Date  . Arthritis    feet  . Bulging lumbar disc    also, herniated disc  . Colon polyps   . Complication of anesthesia    c-section - saw fast moving, flashing lights through sedation.  NO issues since  . Depression    Dr. Lyndle Herrlich from [place]  . GERD (gastroesophageal reflux disease)   . Hypertension   . Phlebitis   . Rosacea   . Varicose vein     Past Surgical History:  Procedure Laterality Date  . ABDOMINAL HYSTERECTOMY  1994   complete for fibroids  . CATARACT EXTRACTION W/PHACO Left 07/04/2015   Procedure: CATARACT EXTRACTION PHACO AND INTRAOCULAR LENS PLACEMENT (IOC);  Surgeon: Leandrew Koyanagi, MD;  Location: [place];  Service: Ophthalmology;  Laterality: Left;  . CATARACT EXTRACTION W/PHACO Right 08/01/2015   Procedure: CATARACT EXTRACTION PHACO AND INTRAOCULAR LENS PLACEMENT (IOC);  Surgeon: Leandrew Koyanagi, MD;  Location: [place];  Service: Ophthalmology;  Laterality: Right;  . CESAREAN SECTION     2  . GASTRIC BYPASS  1980  . HERNIA REPAIR  1995   incisional hernia    There were no vitals filed for this visit.      Subjective Assessment - 09/30/16 1600    Subjective Patient  states the sit to stand exercises are becoming easier to perform but still has difficulty with arising from the commode and the couch.    Pertinent History Hx of LBP (~ 2xyear for past 10 years)   How long can you stand comfortably? 2min until increase in back   How long can you walk comfortably? .2 miles   Patient Stated Goals To walk up the stairs, and transfer from sitting to standing      TREATMENT: Nustep with level 2 resistance - 5 min (Seat position 8)  Sit to stand with arms across chest - 2 x 12 Seated Hamstring curl with GTB - 2 x 15 Tandem stance with intermittent UE support - 3 x 30sec B Standing hip abduction with UE support - 2 x 10 B Standing hip extension with UE support - 2 x 10 B  Side stepping up and over airex - x10 Leg Press at [place] - 2 x 15 75#       PT Education - 09/30/16 1608    Education provided Yes   Education Details form/technique throughout session   Person(s) Educated Patient   Methods Explanation;Demonstration   Comprehension Verbalized understanding;Returned demonstration             PT Long Term Goals - 09/17/16 1142      PT LONG TERM GOAL #1   Title Patient  will improve BERG balance by 8 points to demosntrate significant improvement in fall risk and abiilty to bend down and lift itrems from the floors   Baseline BERG: 44    Time 8   Period Weeks   Status New     PT LONG TERM GOAL #2   Title Patient will improve 5xSTS score to under 16sec to demonstrate decrease fall risk and greater ability to stand up to perform ADLs.   Baseline 5xSTS: 19sec   Time 8   Period Weeks   Status New     PT LONG TERM GOAL #3   Title Patient will improve LEFS score by 12 points to demonstrate singificant improvement in self perceived LE function and greater ability to perfrom stairs.   Baseline LEFS: 24   Time 8   Period Weeks   Status New               Plan - 09/30/16 1609    Clinical Impression Statement Pt demonstrates decreased  muscular strength as demonstrated by difficulty with increased fatigue after performance of sit to stand exercise. Patient will benefit from further skilled therapy focussed on improving limitations to return to PLOF.    Rehab Potential Fair   Clinical Impairments Affecting Rehab Potential (+) family support, highly motivated, (-) age, chronicity of condition   PT Frequency 2x / week   PT Duration 8 weeks   PT Treatment/Interventions Therapeutic activities;Therapeutic exercise;Balance training;Stair training;Gait training;Neuromuscular re-education;Patient/family education;Manual techniques   PT Next Visit Plan Progress strengthening and balance exercise   PT Home Exercise Plan Sit to stands, Seated hip abduction, seated adduction   Consulted and Agree with Plan of Care Patient      Patient will benefit from skilled therapeutic intervention in order to improve the following deficits and impairments:  Abnormal gait, Decreased mobility, Decreased coordination, Increased muscle spasms, Decreased endurance, Decreased strength, Decreased activity tolerance, Decreased balance, Difficulty walking  Visit Diagnosis: Difficulty in walking, not elsewhere classified  Muscle weakness (generalized)  Other lack of coordination     Problem List Patient Active Problem List   Diagnosis Date Noted  . OCD (obsessive compulsive disorder) 06/30/2016  . TMJ tenderness 11/13/2015  . LBP (low back pain) 02/12/2015  . Medicare annual wellness visit, subsequent 08/15/2014  . Allergic rhinitis 01/31/2014  . Routine general medical examination at a health care facility 08/10/2013  . Depression 06/09/2013  . Screening for breast cancer 06/09/2013  . Essential hypertension, benign 06/09/2013  . Other and unspecified hyperlipidemia 06/09/2013  . Rosacea 06/09/2013  . Insomnia 06/09/2013  . Alcohol dependence ([place]) 06/09/2013    Blythe Stanford, PT DPT 09/30/2016, 4:21 PM  [place] MAIN [facility] SERVICES [address] [place], [place], [address] Phone: [phone]   Fax:  [phone]  Name: Caleena Rushlow MRN: [id number] Date of Birth: 1946/07/23

## 2016-10-02 ENCOUNTER — Ambulatory Visit: Payer: Medicare Other

## 2016-10-02 DIAGNOSIS — R278 Other lack of coordination: Secondary | ICD-10-CM

## 2016-10-02 DIAGNOSIS — M6281 Muscle weakness (generalized): Secondary | ICD-10-CM | POA: Diagnosis not present

## 2016-10-02 DIAGNOSIS — R262 Difficulty in walking, not elsewhere classified: Secondary | ICD-10-CM

## 2016-10-02 NOTE — Therapy (Signed)
[place] MAIN [facility] SERVICES [address] [place], [place], [address] Phone: [phone]   Fax:  [phone]  Physical Therapy Treatment  Patient Details  Name: Isabella Hurst MRN: [id number] Date of Birth: 01/31/1946 Referring Provider: Gurney Maxin MD  Encounter Date: 10/02/2016      PT End of Session - 10/02/16 1403    Visit Number 4   Number of Visits 16   Date for PT Re-Evaluation 11/11/16   Authorization Type 4/10 G Code   PT Start Time [id number]   PT Stop Time 1430   PT Time Calculation (min) 45 min   Equipment Utilized During Treatment Gait belt   Activity Tolerance Patient tolerated treatment well   Behavior During Therapy [facility] for tasks assessed/performed      Past Medical History:  Diagnosis Date  . Arthritis    feet  . Bulging lumbar disc    also, herniated disc  . Colon polyps   . Complication of anesthesia    c-section - saw fast moving, flashing lights through sedation.  NO issues since  . Depression    Dr. Lyndle Herrlich from [place]  . GERD (gastroesophageal reflux disease)   . Hypertension   . Phlebitis   . Rosacea   . Varicose vein     Past Surgical History:  Procedure Laterality Date  . ABDOMINAL HYSTERECTOMY  1994   complete for fibroids  . CATARACT EXTRACTION W/PHACO Left 07/04/2015   Procedure: CATARACT EXTRACTION PHACO AND INTRAOCULAR LENS PLACEMENT (IOC);  Surgeon: Leandrew Koyanagi, MD;  Location: [place];  Service: Ophthalmology;  Laterality: Left;  . CATARACT EXTRACTION W/PHACO Right 08/01/2015   Procedure: CATARACT EXTRACTION PHACO AND INTRAOCULAR LENS PLACEMENT (IOC);  Surgeon: Leandrew Koyanagi, MD;  Location: [place];  Service: Ophthalmology;  Laterality: Right;  . CESAREAN SECTION     2  . GASTRIC BYPASS  1980  . HERNIA REPAIR  1995   incisional hernia    There were no vitals filed for this visit.      Subjective Assessment - 10/02/16 1400    Subjective Patient  reports increased back pain yesterday which eased as the day progressed.    Pertinent History Hx of LBP (~ 2xyear for past 10 years)   How long can you stand comfortably? 37min until increase in back   How long can you walk comfortably? .2 miles   Patient Stated Goals To walk up the stairs, and transfer from sitting to standing   Currently in Pain? No/denies       TREATMENT: Nustep with level 2 resistance - 5 min (Seat position 8) Bridges in hookyling with cueing on glute activation - 2 x 10  LTRs in hooklying - 41min x 2 Sit to stand with arms across chest - 2 x 10 Seated Hamstring curl with GTB - 2 x 10 Tandem stance amb with intermittent UE support - 3 x 30sec B Standing hip abduction with UE support - 2 x 10 B Standing hip extension with UE support - 2 x 10 B       PT Education - 10/02/16 1402    Education provided Yes   Education Details HEP: LTRs, bridges   Person(s) Educated Patient   Methods Explanation;Demonstration   Comprehension Verbalized understanding;Returned demonstration             PT Long Term Goals - 09/17/16 1142      PT LONG TERM GOAL #1   Title Patient will improve BERG  balance by 8 points to demosntrate significant improvement in fall risk and abiilty to bend down and lift itrems from the floors   Baseline BERG: 44    Time 8   Period Weeks   Status New     PT LONG TERM GOAL #2   Title Patient will improve 5xSTS score to under 16sec to demonstrate decrease fall risk and greater ability to stand up to perform ADLs.   Baseline 5xSTS: 19sec   Time 8   Period Weeks   Status New     PT LONG TERM GOAL #3   Title Patient will improve LEFS score by 12 points to demonstrate singificant improvement in self perceived LE function and greater ability to perfrom stairs.   Baseline LEFS: 24   Time 8   Period Weeks   Status New               Plan - 10/02/16 1424    Clinical Impression Statement Patient demonstrates increased fatigue today with  exercise performance indicating decreased muscular endurance/strength. Patient will benefit from further skilled therapy focused on improving muscular endurance/strength and balance to return to prior level of function.    Rehab Potential Fair   Clinical Impairments Affecting Rehab Potential (+) family support, highly motivated, (-) age, chronicity of condition   PT Frequency 2x / week   PT Duration 8 weeks   PT Treatment/Interventions Therapeutic activities;Therapeutic exercise;Balance training;Stair training;Gait training;Neuromuscular re-education;Patient/family education;Manual techniques   PT Next Visit Plan Progress strengthening and balance exercise   PT Home Exercise Plan Sit to stands, Seated hip abduction, seated adduction   Consulted and Agree with Plan of Care Patient      Patient will benefit from skilled therapeutic intervention in order to improve the following deficits and impairments:  Abnormal gait, Decreased mobility, Decreased coordination, Increased muscle spasms, Decreased endurance, Decreased strength, Decreased activity tolerance, Decreased balance, Difficulty walking  Visit Diagnosis: Difficulty in walking, not elsewhere classified  Muscle weakness (generalized)  Other lack of coordination     Problem List Patient Active Problem List   Diagnosis Date Noted  . OCD (obsessive compulsive disorder) 06/30/2016  . TMJ tenderness 11/13/2015  . LBP (low back pain) 02/12/2015  . Medicare annual wellness visit, subsequent 08/15/2014  . Allergic rhinitis 01/31/2014  . Routine general medical examination at a health care facility 08/10/2013  . Depression 06/09/2013  . Screening for breast cancer 06/09/2013  . Essential hypertension, benign 06/09/2013  . Other and unspecified hyperlipidemia 06/09/2013  . Rosacea 06/09/2013  . Insomnia 06/09/2013  . Alcohol dependence ([place]) 06/09/2013    Blythe Stanford, PT DPT 10/02/2016, 2:33 PM  [place] MAIN [facility] SERVICES [address] [place], [place], [address] Phone: [phone]   Fax:  [phone]  Name: Isabella Hurst MRN: [id number] Date of Birth: [birth year]

## 2016-10-06 DIAGNOSIS — M79604 Pain in right leg: Secondary | ICD-10-CM | POA: Diagnosis not present

## 2016-10-06 DIAGNOSIS — M6281 Muscle weakness (generalized): Secondary | ICD-10-CM | POA: Diagnosis not present

## 2016-10-06 DIAGNOSIS — M79605 Pain in left leg: Secondary | ICD-10-CM | POA: Diagnosis not present

## 2016-10-07 ENCOUNTER — Ambulatory Visit: Payer: Medicare Other

## 2016-10-07 DIAGNOSIS — R278 Other lack of coordination: Secondary | ICD-10-CM | POA: Diagnosis not present

## 2016-10-07 DIAGNOSIS — R262 Difficulty in walking, not elsewhere classified: Secondary | ICD-10-CM | POA: Diagnosis not present

## 2016-10-07 DIAGNOSIS — M6281 Muscle weakness (generalized): Secondary | ICD-10-CM | POA: Diagnosis not present

## 2016-10-07 NOTE — Therapy (Signed)
[place] MAIN [facility] SERVICES [address] [place], [place], [address] Phone: [phone]   Fax:  [phone]  Physical Therapy Treatment  Patient Details  Name: Isabella Hurst MRN: [id number] Date of Birth: [birth year] Referring Provider: Gurney Maxin MD  Encounter Date: 10/07/2016      PT End of Session - 10/07/16 1004    Visit Number 5   Number of Visits 16   Date for PT Re-Evaluation 11/11/16   Authorization Type 5/10 G Code   PT Start Time 0930   PT Stop Time 1015   PT Time Calculation (min) 45 min   Equipment Utilized During Treatment Gait belt   Activity Tolerance Patient tolerated treatment well   Behavior During Therapy [facility] for tasks assessed/performed      Past Medical History:  Diagnosis Date  . Arthritis    feet  . Bulging lumbar disc    also, herniated disc  . Colon polyps   . Complication of anesthesia    c-section - saw fast moving, flashing lights through sedation.  NO issues since  . Depression    Dr. Lyndle Herrlich from [place]  . GERD (gastroesophageal reflux disease)   . Hypertension   . Phlebitis   . Rosacea   . Varicose vein     Past Surgical History:  Procedure Laterality Date  . ABDOMINAL HYSTERECTOMY  1994   complete for fibroids  . CATARACT EXTRACTION W/PHACO Left 07/04/2015   Procedure: CATARACT EXTRACTION PHACO AND INTRAOCULAR LENS PLACEMENT (IOC);  Surgeon: Leandrew Koyanagi, MD;  Location: [place];  Service: Ophthalmology;  Laterality: Left;  . CATARACT EXTRACTION W/PHACO Right 08/01/2015   Procedure: CATARACT EXTRACTION PHACO AND INTRAOCULAR LENS PLACEMENT (IOC);  Surgeon: Leandrew Koyanagi, MD;  Location: [place];  Service: Ophthalmology;  Laterality: Right;  . CESAREAN SECTION     2  . GASTRIC BYPASS  1980  . HERNIA REPAIR  1995   incisional hernia    There were no vitals filed for this visit.      Subjective Assessment - 10/07/16 0940    Subjective Patient  reports she saw her neurologist yesterday who ruled out some neurological illness   Pertinent History Hx of LBP (~ 2xyear for past 10 years)   How long can you stand comfortably? 44min until increase in back   How long can you walk comfortably? .2 miles   Patient Stated Goals To walk up the stairs, and transfer from sitting to standing   Currently in Pain? No/denies      TREATMENT: Nustep with level 4 resistance - 5 min (Seat position 8) Leg press at quantum - 2 x 20 75# with pillow behind for lumbar support Semi tandem stance - 30sec; head turn looking up/down  Standing hip abduction with UE support - 2 x 10 B Standing hip extension with UE support - 2 x 10 B B Sit to stand with arms across chest - x15, 17 Side stepping on airex beam - x 3 down and back        PT Education - 10/07/16 1001    Education provided Yes   Education Details Form/technique throughout session   Person(s) Educated Patient   Methods Explanation;Demonstration   Comprehension Verbalized understanding;Returned demonstration             PT Long Term Goals - 09/17/16 1142      PT LONG TERM GOAL #1   Title Patient will improve BERG balance by 8 points to  demosntrate significant improvement in fall risk and abiilty to bend down and lift itrems from the floors   Baseline BERG: 44    Time 8   Period Weeks   Status New     PT LONG TERM GOAL #2   Title Patient will improve 5xSTS score to under 16sec to demonstrate decrease fall risk and greater ability to stand up to perform ADLs.   Baseline 5xSTS: 19sec   Time 8   Period Weeks   Status New     PT LONG TERM GOAL #3   Title Patient will improve LEFS score by 12 points to demonstrate singificant improvement in self perceived LE function and greater ability to perfrom stairs.   Baseline LEFS: 24   Time 8   Period Weeks   Status New               Plan - 10/07/16 1044    Clinical Impression Statement Patient demonstrates greater ability to  perform sit to stand exercises indicating improvement in LE strength and function. Although patient is improving, she continues to demonstrate decrease in dynamic balance and will benefit from further skilled therapy to return to prior level of function.    Rehab Potential Fair   Clinical Impairments Affecting Rehab Potential (+) family support, highly motivated, (-) age, chronicity of condition   PT Frequency 2x / week   PT Duration 8 weeks   PT Treatment/Interventions Therapeutic activities;Therapeutic exercise;Balance training;Stair training;Gait training;Neuromuscular re-education;Patient/family education;Manual techniques   PT Next Visit Plan Progress strengthening and balance exercise   PT Home Exercise Plan Sit to stands, Seated hip abduction, seated adduction   Consulted and Agree with Plan of Care Patient      Patient will benefit from skilled therapeutic intervention in order to improve the following deficits and impairments:  Abnormal gait, Decreased mobility, Decreased coordination, Increased muscle spasms, Decreased endurance, Decreased strength, Decreased activity tolerance, Decreased balance, Difficulty walking  Visit Diagnosis: Difficulty in walking, not elsewhere classified  Muscle weakness (generalized)     Problem List Patient Active Problem List   Diagnosis Date Noted  . OCD (obsessive compulsive disorder) 06/30/2016  . TMJ tenderness 11/13/2015  . LBP (low back pain) 02/12/2015  . Medicare annual wellness visit, subsequent 08/15/2014  . Allergic rhinitis 01/31/2014  . Routine general medical examination at a health care facility 08/10/2013  . Depression 06/09/2013  . Screening for breast cancer 06/09/2013  . Essential hypertension, benign 06/09/2013  . Other and unspecified hyperlipidemia 06/09/2013  . Rosacea 06/09/2013  . Insomnia 06/09/2013  . Alcohol dependence ([place]) 06/09/2013    Blythe Stanford, PT DPT 10/07/2016, 10:59 AM  [place] MAIN [facility] SERVICES [address] [place], [place], [address] Phone: [phone]   Fax:  [phone]  Name: Kaisa Maciejewski MRN: [id number] Date of Birth: 12-14-1945

## 2016-10-09 ENCOUNTER — Ambulatory Visit: Payer: Medicare Other

## 2016-10-09 DIAGNOSIS — M6281 Muscle weakness (generalized): Secondary | ICD-10-CM

## 2016-10-09 DIAGNOSIS — R278 Other lack of coordination: Secondary | ICD-10-CM | POA: Diagnosis not present

## 2016-10-09 DIAGNOSIS — R262 Difficulty in walking, not elsewhere classified: Secondary | ICD-10-CM

## 2016-10-09 NOTE — Therapy (Signed)
[place] MAIN [facility] SERVICES [address] [place], [place], [address] Phone: [phone]   Fax:  [phone]  Physical Therapy Treatment  Patient Details  Name: Isabella Hurst MRN: [id number] Date of Birth: 02/23/1946 Referring Provider: Gurney Maxin MD  Encounter Date: 10/09/2016      PT End of Session - 10/09/16 1306    Visit Number 6   Number of Visits 16   Date for PT Re-Evaluation 11/11/16   Authorization Type 6/10 G Code   PT Start Time 1030   PT Stop Time 1115   PT Time Calculation (min) 45 min   Equipment Utilized During Treatment Gait belt   Activity Tolerance Patient tolerated treatment well   Behavior During Therapy [facility] for tasks assessed/performed      Past Medical History:  Diagnosis Date  . Arthritis    feet  . Bulging lumbar disc    also, herniated disc  . Colon polyps   . Complication of anesthesia    c-section - saw fast moving, flashing lights through sedation.  NO issues since  . Depression    Dr. Lyndle Herrlich from [place]  . GERD (gastroesophageal reflux disease)   . Hypertension   . Phlebitis   . Rosacea   . Varicose vein     Past Surgical History:  Procedure Laterality Date  . ABDOMINAL HYSTERECTOMY  1994   complete for fibroids  . CATARACT EXTRACTION W/PHACO Left 07/04/2015   Procedure: CATARACT EXTRACTION PHACO AND INTRAOCULAR LENS PLACEMENT (IOC);  Surgeon: Leandrew Koyanagi, MD;  Location: [place];  Service: Ophthalmology;  Laterality: Left;  . CATARACT EXTRACTION W/PHACO Right 08/01/2015   Procedure: CATARACT EXTRACTION PHACO AND INTRAOCULAR LENS PLACEMENT (IOC);  Surgeon: Leandrew Koyanagi, MD;  Location: [place];  Service: Ophthalmology;  Laterality: Right;  . CESAREAN SECTION     2  . GASTRIC BYPASS  1980  . HERNIA REPAIR  1995   incisional hernia    There were no vitals filed for this visit.      Subjective Assessment - 10/09/16 1036    Subjective Patient  reports shes getting stronger and her brother is getting better with his illness   Pertinent History Hx of LBP (~ 2xyear for past 10 years)   How long can you stand comfortably? 61min until increase in back   How long can you walk comfortably? .2 miles   Patient Stated Goals To walk up the stairs, and transfer from sitting to standing   Currently in Pain? No/denies      TREATMENT: Nustep with level 5 resistance - 6 min (Seat position 8) Leg press at quantum - 2 x 20 90# with pillow behind for lumbar support; calf raises 2 x 20 90# Standing hip abduction with UE support - 2 x 12 B Feet together  - 30sec; head turns looking up/down, right/left x 10 , EC EO  B Sit to stand with arms across chest - 2x17 Side stepping on airex beam - x 5 down and back       PT Education - 10/09/16 1305    Education provided Yes   Education Details Educated on POC and progression of exercise at home   Person(s) Educated Patient   Methods Explanation;Demonstration   Comprehension Verbalized understanding;Returned demonstration             PT Long Term Goals - 09/17/16 1142      PT LONG TERM GOAL #1   Title Patient will improve  BERG balance by 8 points to demosntrate significant improvement in fall risk and abiilty to bend down and lift itrems from the floors   Baseline BERG: 44    Time 8   Period Weeks   Status New     PT LONG TERM GOAL #2   Title Patient will improve 5xSTS score to under 16sec to demonstrate decrease fall risk and greater ability to stand up to perform ADLs.   Baseline 5xSTS: 19sec   Time 8   Period Weeks   Status New     PT LONG TERM GOAL #3   Title Patient will improve LEFS score by 12 points to demonstrate singificant improvement in self perceived LE function and greater ability to perfrom stairs.   Baseline LEFS: 24   Time 8   Period Weeks   Status New               Plan - 10/09/16 1306    Clinical Impression Statement Patient demonstrates decreased  hip strategies and postural sway with performing balancing exercises indicating functional carryover between visits. Although patient is improving she continues to demonstrate decreased balance and LE strength and pt will benefit from further skilled therapy to return to prior level of function.    Rehab Potential Fair   Clinical Impairments Affecting Rehab Potential (+) family support, highly motivated, (-) age, chronicity of condition   PT Frequency 2x / week   PT Duration 8 weeks   PT Treatment/Interventions Therapeutic activities;Therapeutic exercise;Balance training;Stair training;Gait training;Neuromuscular re-education;Patient/family education;Manual techniques   PT Next Visit Plan Progress strengthening and balance exercise   PT Home Exercise Plan Sit to stands, Seated hip abduction, seated adduction   Consulted and Agree with Plan of Care Patient      Patient will benefit from skilled therapeutic intervention in order to improve the following deficits and impairments:  Abnormal gait, Decreased mobility, Decreased coordination, Increased muscle spasms, Decreased endurance, Decreased strength, Decreased activity tolerance, Decreased balance, Difficulty walking  Visit Diagnosis: Difficulty in walking, not elsewhere classified  Muscle weakness (generalized)  Other lack of coordination     Problem List Patient Active Problem List   Diagnosis Date Noted  . OCD (obsessive compulsive disorder) 06/30/2016  . TMJ tenderness 11/13/2015  . LBP (low back pain) 02/12/2015  . Medicare annual wellness visit, subsequent 08/15/2014  . Allergic rhinitis 01/31/2014  . Routine general medical examination at a health care facility 08/10/2013  . Depression 06/09/2013  . Screening for breast cancer 06/09/2013  . Essential hypertension, benign 06/09/2013  . Other and unspecified hyperlipidemia 06/09/2013  . Rosacea 06/09/2013  . Insomnia 06/09/2013  . Alcohol dependence ([place]) 06/09/2013     Blythe Stanford, PT DPT 10/09/2016, 1:11 PM  [place] MAIN [facility] SERVICES [address] [place], [place], [address] Phone: [phone]   Fax:  [phone]  Name: Isabella Hurst MRN: [id number] Date of Birth: 1946-07-05

## 2016-10-14 ENCOUNTER — Ambulatory Visit: Payer: Medicare Other

## 2016-10-16 ENCOUNTER — Ambulatory Visit: Payer: Medicare Other

## 2016-10-21 ENCOUNTER — Ambulatory Visit: Payer: Medicare Other

## 2016-10-21 DIAGNOSIS — R278 Other lack of coordination: Secondary | ICD-10-CM

## 2016-10-21 DIAGNOSIS — M6281 Muscle weakness (generalized): Secondary | ICD-10-CM | POA: Diagnosis not present

## 2016-10-21 DIAGNOSIS — R262 Difficulty in walking, not elsewhere classified: Secondary | ICD-10-CM

## 2016-10-21 NOTE — Therapy (Signed)
[place] MAIN [facility] SERVICES [address] [place], [place], [address] Phone: [phone]   Fax:  [phone]  Physical Therapy Treatment  Patient Details  Name: Isabella Hurst MRN: [id number] Date of Birth: Feb 24, 1946 Referring Provider: Gurney Maxin MD  Encounter Date: 10/21/2016      PT End of Session - 10/21/16 1157    Visit Number 7   Number of Visits 16   Date for PT Re-Evaluation 11/11/16   Authorization Type 7/10 G Code   PT Start Time 1115   PT Stop Time 1200   PT Time Calculation (min) 45 min   Equipment Utilized During Treatment Gait belt   Activity Tolerance Patient tolerated treatment well   Behavior During Therapy [facility] for tasks assessed/performed      Past Medical History:  Diagnosis Date  . Arthritis    feet  . Bulging lumbar disc    also, herniated disc  . Colon polyps   . Complication of anesthesia    c-section - saw fast moving, flashing lights through sedation.  NO issues since  . Depression    Dr. Lyndle Herrlich from [place]  . GERD (gastroesophageal reflux disease)   . Hypertension   . Phlebitis   . Rosacea   . Varicose vein     Past Surgical History:  Procedure Laterality Date  . ABDOMINAL HYSTERECTOMY  1994   complete for fibroids  . CATARACT EXTRACTION W/PHACO Left 07/04/2015   Procedure: CATARACT EXTRACTION PHACO AND INTRAOCULAR LENS PLACEMENT (IOC);  Surgeon: Leandrew Koyanagi, MD;  Location: [place];  Service: Ophthalmology;  Laterality: Left;  . CATARACT EXTRACTION W/PHACO Right 08/01/2015   Procedure: CATARACT EXTRACTION PHACO AND INTRAOCULAR LENS PLACEMENT (IOC);  Surgeon: Leandrew Koyanagi, MD;  Location: [place];  Service: Ophthalmology;  Laterality: Right;  . CESAREAN SECTION     2  . GASTRIC BYPASS  1980  . HERNIA REPAIR  1995   incisional hernia    There were no vitals filed for this visit.      Subjective Assessment - 10/21/16 1123    Subjective Patient  reports she's feeling increased stressed from her brother's condition.    Pertinent History Hx of LBP (~ 2xyear for past 10 years)   How long can you stand comfortably? 52min until increase in back   How long can you walk comfortably? .2 miles   Patient Stated Goals To walk up the stairs, and transfer from sitting to standing   Currently in Pain? No/denies      TREATMENT: Nustep with level 3 resistance - 6 min (Seat position 8) Heel raises on half blue -2 x 11 Standing hip abduction with UE support - 2 x 15 B B Sit to stand with arms across chest - 2x17 Tandem ambulation with minA from PT for balance Marches in standing without UE support - 2 x 10  Leg press at quantum - 2 x 20 90# with pillow behind for lumbar support; calf raises 2 x 20 90#      PT Education - 10/21/16 1128    Education provided Yes   Education Details Form and technique throughout session   Person(s) Educated Patient   Methods Explanation;Demonstration   Comprehension Verbalized understanding;Returned demonstration             PT Long Term Goals - 09/17/16 1142      PT LONG TERM GOAL #1   Title Patient will improve BERG balance by 8 points to demosntrate significant  improvement in fall risk and abiilty to bend down and lift itrems from the floors   Baseline BERG: 44    Time 8   Period Weeks   Status New     PT LONG TERM GOAL #2   Title Patient will improve 5xSTS score to under 16sec to demonstrate decrease fall risk and greater ability to stand up to perform ADLs.   Baseline 5xSTS: 19sec   Time 8   Period Weeks   Status New     PT LONG TERM GOAL #3   Title Patient will improve LEFS score by 12 points to demonstrate singificant improvement in self perceived LE function and greater ability to perfrom stairs.   Baseline LEFS: 24   Time 8   Period Weeks   Status New               Plan - 10/21/16 1159    Clinical Impression Statement Patient demonstrates improved muscular endurance  today versus previous sessions indicating functional carryover between visits. Although patient is improving, she continues to demonstrate decreased balance and muscular strength and will benefit from further skilled therapy to return to prior level of function.     Rehab Potential Fair   Clinical Impairments Affecting Rehab Potential (+) family support, highly motivated, (-) age, chronicity of condition   PT Frequency 2x / week   PT Duration 8 weeks   PT Treatment/Interventions Therapeutic activities;Therapeutic exercise;Balance training;Stair training;Gait training;Neuromuscular re-education;Patient/family education;Manual techniques   PT Next Visit Plan Progress strengthening and balance exercise   PT Home Exercise Plan Sit to stands, Seated hip abduction, seated adduction   Consulted and Agree with Plan of Care Patient      Patient will benefit from skilled therapeutic intervention in order to improve the following deficits and impairments:  Abnormal gait, Decreased mobility, Decreased coordination, Increased muscle spasms, Decreased endurance, Decreased strength, Decreased activity tolerance, Decreased balance, Difficulty walking  Visit Diagnosis: Difficulty in walking, not elsewhere classified  Muscle weakness (generalized)  Other lack of coordination     Problem List Patient Active Problem List   Diagnosis Date Noted  . OCD (obsessive compulsive disorder) 06/30/2016  . TMJ tenderness 11/13/2015  . LBP (low back pain) 02/12/2015  . Medicare annual wellness visit, subsequent 08/15/2014  . Allergic rhinitis 01/31/2014  . Routine general medical examination at a health care facility 08/10/2013  . Depression 06/09/2013  . Screening for breast cancer 06/09/2013  . Essential hypertension, benign 06/09/2013  . Other and unspecified hyperlipidemia 06/09/2013  . Rosacea 06/09/2013  . Insomnia 06/09/2013  . Alcohol dependence ([place]) 06/09/2013    Blythe Stanford, PT  DPT 10/21/2016, 12:46 PM  [place] MAIN [facility] SERVICES [address] [place], [place], [address] Phone: [phone]   Fax:  [phone]  Name: Isabella Hurst MRN: [id number] Date of Birth: December 16, 1945

## 2016-10-23 ENCOUNTER — Ambulatory Visit: Payer: Medicare Other

## 2016-10-28 ENCOUNTER — Ambulatory Visit: Payer: Medicare Other

## 2016-10-28 DIAGNOSIS — R278 Other lack of coordination: Secondary | ICD-10-CM

## 2016-10-28 DIAGNOSIS — R262 Difficulty in walking, not elsewhere classified: Secondary | ICD-10-CM | POA: Diagnosis not present

## 2016-10-28 DIAGNOSIS — M6281 Muscle weakness (generalized): Secondary | ICD-10-CM

## 2016-10-28 NOTE — Therapy (Signed)
[place] MAIN [facility] SERVICES [address] [place], [place], [address] Phone: [phone]   Fax:  [phone]  Physical Therapy Treatment  Patient Details  Name: Isabella Hurst MRN: [id number] Date of Birth: 09-05-1946 Referring Provider: Gurney Maxin MD  Encounter Date: 10/28/2016      PT End of Session - 10/28/16 1308    Visit Number 8   Number of Visits 16   Date for PT Re-Evaluation 11/11/16   Authorization Type 8/10 G Code   PT Start Time 1115   PT Stop Time 1200   PT Time Calculation (min) 45 min   Equipment Utilized During Treatment Gait belt   Activity Tolerance Patient tolerated treatment well   Behavior During Therapy [facility] for tasks assessed/performed      Past Medical History:  Diagnosis Date  . Arthritis    feet  . Bulging lumbar disc    also, herniated disc  . Colon polyps   . Complication of anesthesia    c-section - saw fast moving, flashing lights through sedation.  NO issues since  . Depression    Dr. Lyndle Herrlich from [place]  . GERD (gastroesophageal reflux disease)   . Hypertension   . Phlebitis   . Rosacea   . Varicose vein     Past Surgical History:  Procedure Laterality Date  . ABDOMINAL HYSTERECTOMY  1994   complete for fibroids  . CATARACT EXTRACTION W/PHACO Left 07/04/2015   Procedure: CATARACT EXTRACTION PHACO AND INTRAOCULAR LENS PLACEMENT (IOC);  Surgeon: Leandrew Koyanagi, MD;  Location: [place];  Service: Ophthalmology;  Laterality: Left;  . CATARACT EXTRACTION W/PHACO Right 08/01/2015   Procedure: CATARACT EXTRACTION PHACO AND INTRAOCULAR LENS PLACEMENT (IOC);  Surgeon: Leandrew Koyanagi, MD;  Location: [place];  Service: Ophthalmology;  Laterality: Right;  . CESAREAN SECTION     2  . GASTRIC BYPASS  1980  . HERNIA REPAIR  1995   incisional hernia    There were no vitals filed for this visit.      Subjective Assessment - 10/28/16 1307    Subjective Patient  reports she would like to go to therapy 1xweek secondary to her schedule.    Pertinent History Hx of LBP (~ 2xyear for past 10 years)   How long can you stand comfortably? 45min until increase in back   How long can you walk comfortably? .2 miles   Patient Stated Goals To walk up the stairs, and transfer from sitting to standing   Currently in Pain? No/denies      TREATMENT: Nustep with level 3 resistance - 7 min (Seat position 8) Heel raises on half blue -2 x 11 Standing hip abduction with UE support with foot supported on balance stone - 2 x 15 B Standing hip extension with UE support with foot supported on balance stone - 2 x 10 B  B Sit to stand with arms overhead with 2# bar- 2x10 Tandem ambulation from PT for balance - 2 x 103ft; Backward ambulation - 2 x 54ft        PT Education - 10/28/16 1307    Education provided Yes   Education Details form and technique with exercise   Person(s) Educated Patient   Methods Explanation;Demonstration   Comprehension Verbalized understanding;Returned demonstration             PT Long Term Goals - 09/17/16 1142      PT LONG TERM GOAL #1   Title Patient will improve BERG  balance by 8 points to demosntrate significant improvement in fall risk and abiilty to bend down and lift itrems from the floors   Baseline BERG: 44    Time 8   Period Weeks   Status New     PT LONG TERM GOAL #2   Title Patient will improve 5xSTS score to under 16sec to demonstrate decrease fall risk and greater ability to stand up to perform ADLs.   Baseline 5xSTS: 19sec   Time 8   Period Weeks   Status New     PT LONG TERM GOAL #3   Title Patient will improve LEFS score by 12 points to demonstrate singificant improvement in self perceived LE function and greater ability to perfrom stairs.   Baseline LEFS: 24   Time 8   Period Weeks   Status New               Plan - 10/28/16 1309    Clinical Impression Statement Focused on performing balancing  activites to decrease fall risk and patient demonstrates decreased postural sway when performing tandem ambulation indicating functional carryover between visits. Although patient is improving, she conitnues to require use of UEs to balance to higher level balancing activities and patient will benefit from further skilled therapy to return to prior level of function.    Rehab Potential Fair   Clinical Impairments Affecting Rehab Potential (+) family support, highly motivated, (-) age, chronicity of condition   PT Frequency 2x / week   PT Duration 8 weeks   PT Treatment/Interventions Therapeutic activities;Therapeutic exercise;Balance training;Stair training;Gait training;Neuromuscular re-education;Patient/family education;Manual techniques   PT Next Visit Plan Progress strengthening and balance exercise   PT Home Exercise Plan Sit to stands, Seated hip abduction, seated adduction   Consulted and Agree with Plan of Care Patient      Patient will benefit from skilled therapeutic intervention in order to improve the following deficits and impairments:  Abnormal gait, Decreased mobility, Decreased coordination, Increased muscle spasms, Decreased endurance, Decreased strength, Decreased activity tolerance, Decreased balance, Difficulty walking  Visit Diagnosis: Difficulty in walking, not elsewhere classified  Muscle weakness (generalized)  Other lack of coordination     Problem List Patient Active Problem List   Diagnosis Date Noted  . OCD (obsessive compulsive disorder) 06/30/2016  . TMJ tenderness 11/13/2015  . LBP (low back pain) 02/12/2015  . Medicare annual wellness visit, subsequent 08/15/2014  . Allergic rhinitis 01/31/2014  . Routine general medical examination at a health care facility 08/10/2013  . Depression 06/09/2013  . Screening for breast cancer 06/09/2013  . Essential hypertension, benign 06/09/2013  . Other and unspecified hyperlipidemia 06/09/2013  . Rosacea  06/09/2013  . Insomnia 06/09/2013  . Alcohol dependence ([place]) 06/09/2013    Blythe Stanford, PT DPT 10/28/2016, 1:19 PM  [place] MAIN [facility] SERVICES [address] [place], [place], [address] Phone: [phone]   Fax:  [phone]  Name: Isabella Hurst MRN: [id number] Date of Birth: 1946-05-08

## 2016-11-04 ENCOUNTER — Ambulatory Visit: Payer: Medicare Other | Attending: Neurology

## 2016-11-04 DIAGNOSIS — R278 Other lack of coordination: Secondary | ICD-10-CM | POA: Diagnosis not present

## 2016-11-04 DIAGNOSIS — M6281 Muscle weakness (generalized): Secondary | ICD-10-CM | POA: Diagnosis not present

## 2016-11-04 DIAGNOSIS — R262 Difficulty in walking, not elsewhere classified: Secondary | ICD-10-CM | POA: Insufficient documentation

## 2016-11-04 NOTE — Therapy (Signed)
[place] MAIN [facility] SERVICES [address] [place], [place], [address] Phone: [phone]   Fax:  [phone]  Physical Therapy Treatment  Patient Details  Name: Mirlande Deschaine MRN: [id number] Date of Birth: [birth year] Referring Provider: Gurney Maxin MD  Encounter Date: 11/04/2016      PT End of Session - 11/04/16 1659    Visit Number 9   Number of Visits 16   Date for PT Re-Evaluation 11/11/16   Authorization Type 9/10 G Code   PT Start Time 1645   PT Stop Time 1730   PT Time Calculation (min) 45 min   Equipment Utilized During Treatment Gait belt   Activity Tolerance Patient tolerated treatment well   Behavior During Therapy [facility] for tasks assessed/performed      Past Medical History:  Diagnosis Date  . Arthritis    feet  . Bulging lumbar disc    also, herniated disc  . Colon polyps   . Complication of anesthesia    c-section - saw fast moving, flashing lights through sedation.  NO issues since  . Depression    Dr. Lyndle Herrlich from [place]  . GERD (gastroesophageal reflux disease)   . Hypertension   . Phlebitis   . Rosacea   . Varicose vein     Past Surgical History:  Procedure Laterality Date  . ABDOMINAL HYSTERECTOMY  1994   complete for fibroids  . CATARACT EXTRACTION W/PHACO Left 07/04/2015   Procedure: CATARACT EXTRACTION PHACO AND INTRAOCULAR LENS PLACEMENT (IOC);  Surgeon: Leandrew Koyanagi, MD;  Location: [place];  Service: Ophthalmology;  Laterality: Left;  . CATARACT EXTRACTION W/PHACO Right 08/01/2015   Procedure: CATARACT EXTRACTION PHACO AND INTRAOCULAR LENS PLACEMENT (IOC);  Surgeon: Leandrew Koyanagi, MD;  Location: [place];  Service: Ophthalmology;  Laterality: Right;  . CESAREAN SECTION     2  . GASTRIC BYPASS  1980  . HERNIA REPAIR  1995   incisional hernia    There were no vitals filed for this visit.      Subjective Assessment - 11/04/16 1653    Subjective Patient  reports she's feeling alright and states she went to a balance class; reports the class went well.    Pertinent History Hx of LBP (~ 2xyear for past 10 years)   How long can you stand comfortably? 44min until increase in back   How long can you walk comfortably? .2 miles   Patient Stated Goals To walk up the stairs, and transfer from sitting to standing   Currently in Pain? No/denies        TREATMENT: Nustep with level 4 resistance - 7.5 min (Seat position 8) Leg Press at quantum 120# -- 2 x 20 Hip adduction with glute squeeze - 2 x 20 with 4# ball  Tandem ambulation from PT for balance - 2 x 73ft; Backward ambulation - 2 x 48ft Standing hip abduction with UE support with foot supported on balance stone - 2 x 10 B Heel taps from airex pad to balance stone placed on 4" step - x15 B       PT Education - 11/04/16 1658    Education provided Yes   Education Details form/technique with exercise   Person(s) Educated Patient   Methods Explanation;Demonstration   Comprehension Verbalized understanding;Returned demonstration             PT Long Term Goals - 09/17/16 1142      PT LONG TERM GOAL #1   Title Patient  will improve BERG balance by 8 points to demosntrate significant improvement in fall risk and abiilty to bend down and lift itrems from the floors   Baseline BERG: 44    Time 8   Period Weeks   Status New     PT LONG TERM GOAL #2   Title Patient will improve 5xSTS score to under 16sec to demonstrate decrease fall risk and greater ability to stand up to perform ADLs.   Baseline 5xSTS: 19sec   Time 8   Period Weeks   Status New     PT LONG TERM GOAL #3   Title Patient will improve LEFS score by 12 points to demonstrate singificant improvement in self perceived LE function and greater ability to perfrom stairs.   Baseline LEFS: 24   Time 8   Period Weeks   Status New               Plan - 11/04/16 1743    Clinical Impression Statement Patient  demonstrates improved performance with tandem amb requiring less step down support to perform indicating functional improvement with dynamic balance. Although patient is improving, she continues to demonstrate decreased LE strength and single leg stance balance, patient will benefit from further skilled therapy to return to prior level of function.    Rehab Potential Fair   Clinical Impairments Affecting Rehab Potential (+) family support, highly motivated, (-) age, chronicity of condition   PT Frequency 2x / week   PT Duration 8 weeks   PT Treatment/Interventions Therapeutic activities;Therapeutic exercise;Balance training;Stair training;Gait training;Neuromuscular re-education;Patient/family education;Manual techniques   PT Next Visit Plan Progress strengthening and balance exercise   PT Home Exercise Plan Sit to stands, Seated hip abduction, seated adduction   Consulted and Agree with Plan of Care Patient      Patient will benefit from skilled therapeutic intervention in order to improve the following deficits and impairments:  Abnormal gait, Decreased mobility, Decreased coordination, Increased muscle spasms, Decreased endurance, Decreased strength, Decreased activity tolerance, Decreased balance, Difficulty walking  Visit Diagnosis: Difficulty in walking, not elsewhere classified  Muscle weakness (generalized)  Other lack of coordination     Problem List Patient Active Problem List   Diagnosis Date Noted  . OCD (obsessive compulsive disorder) 06/30/2016  . TMJ tenderness 11/13/2015  . LBP (low back pain) 02/12/2015  . Medicare annual wellness visit, subsequent 08/15/2014  . Allergic rhinitis 01/31/2014  . Routine general medical examination at a health care facility 08/10/2013  . Depression 06/09/2013  . Screening for breast cancer 06/09/2013  . Essential hypertension, benign 06/09/2013  . Other and unspecified hyperlipidemia 06/09/2013  . Rosacea 06/09/2013  . Insomnia  06/09/2013  . Alcohol dependence ([place]) 06/09/2013    Blythe Stanford, PT DPT 11/04/2016, 5:46 PM  [place] MAIN [facility] SERVICES [address] [place], [place], [address] Phone: [phone]   Fax:  [phone]  Name: Clarabelle Rattan MRN: [id number] Date of Birth: 02/17/1946

## 2016-11-14 ENCOUNTER — Ambulatory Visit: Payer: Medicare Other

## 2016-11-17 DIAGNOSIS — I679 Cerebrovascular disease, unspecified: Secondary | ICD-10-CM | POA: Diagnosis not present

## 2016-11-17 DIAGNOSIS — R011 Cardiac murmur, unspecified: Secondary | ICD-10-CM | POA: Diagnosis not present

## 2016-11-17 DIAGNOSIS — R55 Syncope and collapse: Secondary | ICD-10-CM | POA: Diagnosis not present

## 2016-11-17 DIAGNOSIS — M6281 Muscle weakness (generalized): Secondary | ICD-10-CM | POA: Diagnosis not present

## 2016-11-18 ENCOUNTER — Ambulatory Visit: Payer: Medicare Other

## 2016-11-18 DIAGNOSIS — R55 Syncope and collapse: Secondary | ICD-10-CM | POA: Diagnosis not present

## 2016-11-18 DIAGNOSIS — I6523 Occlusion and stenosis of bilateral carotid arteries: Secondary | ICD-10-CM | POA: Diagnosis not present

## 2016-11-18 DIAGNOSIS — R0602 Shortness of breath: Secondary | ICD-10-CM | POA: Diagnosis not present

## 2016-11-18 DIAGNOSIS — I10 Essential (primary) hypertension: Secondary | ICD-10-CM | POA: Diagnosis not present

## 2016-11-25 ENCOUNTER — Ambulatory Visit: Payer: Medicare Other

## 2016-11-27 DIAGNOSIS — R55 Syncope and collapse: Secondary | ICD-10-CM | POA: Diagnosis not present

## 2016-11-28 DIAGNOSIS — R55 Syncope and collapse: Secondary | ICD-10-CM | POA: Diagnosis not present

## 2016-12-04 DIAGNOSIS — R0602 Shortness of breath: Secondary | ICD-10-CM | POA: Diagnosis not present

## 2016-12-04 DIAGNOSIS — I10 Essential (primary) hypertension: Secondary | ICD-10-CM | POA: Diagnosis not present

## 2016-12-04 DIAGNOSIS — R001 Bradycardia, unspecified: Secondary | ICD-10-CM | POA: Diagnosis not present

## 2016-12-09 DIAGNOSIS — D45 Polycythemia vera: Secondary | ICD-10-CM | POA: Diagnosis not present

## 2016-12-09 DIAGNOSIS — G4710 Hypersomnia, unspecified: Secondary | ICD-10-CM | POA: Diagnosis not present

## 2016-12-09 DIAGNOSIS — R001 Bradycardia, unspecified: Secondary | ICD-10-CM | POA: Diagnosis not present

## 2016-12-22 DIAGNOSIS — H401492 Capsular glaucoma with pseudoexfoliation of lens, unspecified eye, moderate stage: Secondary | ICD-10-CM | POA: Diagnosis not present

## 2016-12-23 DIAGNOSIS — G4710 Hypersomnia, unspecified: Secondary | ICD-10-CM | POA: Diagnosis not present

## 2016-12-29 DIAGNOSIS — L918 Other hypertrophic disorders of the skin: Secondary | ICD-10-CM | POA: Diagnosis not present

## 2016-12-29 DIAGNOSIS — L821 Other seborrheic keratosis: Secondary | ICD-10-CM | POA: Diagnosis not present

## 2016-12-29 DIAGNOSIS — L309 Dermatitis, unspecified: Secondary | ICD-10-CM | POA: Diagnosis not present

## 2016-12-29 DIAGNOSIS — L708 Other acne: Secondary | ICD-10-CM | POA: Diagnosis not present

## 2016-12-31 DIAGNOSIS — R0602 Shortness of breath: Secondary | ICD-10-CM | POA: Diagnosis not present

## 2017-01-05 DIAGNOSIS — G4710 Hypersomnia, unspecified: Secondary | ICD-10-CM | POA: Diagnosis not present

## 2017-01-05 DIAGNOSIS — R0602 Shortness of breath: Secondary | ICD-10-CM | POA: Diagnosis not present

## 2017-01-10 ENCOUNTER — Other Ambulatory Visit: Payer: Self-pay | Admitting: Family

## 2017-01-14 DIAGNOSIS — R0602 Shortness of breath: Secondary | ICD-10-CM | POA: Diagnosis not present

## 2017-01-22 DIAGNOSIS — R0602 Shortness of breath: Secondary | ICD-10-CM | POA: Diagnosis not present

## 2017-02-02 DIAGNOSIS — L249 Irritant contact dermatitis, unspecified cause: Secondary | ICD-10-CM | POA: Diagnosis not present

## 2017-02-02 DIAGNOSIS — L821 Other seborrheic keratosis: Secondary | ICD-10-CM | POA: Diagnosis not present

## 2017-02-02 DIAGNOSIS — L719 Rosacea, unspecified: Secondary | ICD-10-CM | POA: Diagnosis not present

## 2017-02-09 DIAGNOSIS — R001 Bradycardia, unspecified: Secondary | ICD-10-CM | POA: Diagnosis not present

## 2017-02-09 DIAGNOSIS — R55 Syncope and collapse: Secondary | ICD-10-CM | POA: Diagnosis not present

## 2017-02-09 DIAGNOSIS — R0602 Shortness of breath: Secondary | ICD-10-CM | POA: Diagnosis not present

## 2017-02-26 DIAGNOSIS — E538 Deficiency of other specified B group vitamins: Secondary | ICD-10-CM | POA: Diagnosis not present

## 2017-02-26 DIAGNOSIS — R001 Bradycardia, unspecified: Secondary | ICD-10-CM | POA: Diagnosis not present

## 2017-02-26 DIAGNOSIS — I10 Essential (primary) hypertension: Secondary | ICD-10-CM | POA: Diagnosis not present

## 2017-02-26 DIAGNOSIS — E619 Deficiency of nutrient element, unspecified: Secondary | ICD-10-CM | POA: Diagnosis not present

## 2017-02-26 DIAGNOSIS — M6289 Other specified disorders of muscle: Secondary | ICD-10-CM | POA: Diagnosis not present

## 2017-02-26 DIAGNOSIS — N390 Urinary tract infection, site not specified: Secondary | ICD-10-CM | POA: Diagnosis not present

## 2017-02-26 DIAGNOSIS — E782 Mixed hyperlipidemia: Secondary | ICD-10-CM | POA: Diagnosis not present

## 2017-02-26 DIAGNOSIS — Z0001 Encounter for general adult medical examination with abnormal findings: Secondary | ICD-10-CM | POA: Diagnosis not present

## 2017-02-26 DIAGNOSIS — F331 Major depressive disorder, recurrent, moderate: Secondary | ICD-10-CM | POA: Diagnosis not present

## 2017-02-26 DIAGNOSIS — E617 Deficiency of multiple nutrient elements: Secondary | ICD-10-CM | POA: Diagnosis not present

## 2017-02-26 DIAGNOSIS — D45 Polycythemia vera: Secondary | ICD-10-CM | POA: Diagnosis not present

## 2017-05-18 DIAGNOSIS — F429 Obsessive-compulsive disorder, unspecified: Secondary | ICD-10-CM | POA: Diagnosis not present

## 2017-05-18 DIAGNOSIS — G4700 Insomnia, unspecified: Secondary | ICD-10-CM | POA: Diagnosis not present

## 2017-05-18 DIAGNOSIS — F3342 Major depressive disorder, recurrent, in full remission: Secondary | ICD-10-CM | POA: Diagnosis not present

## 2017-05-21 DIAGNOSIS — H401422 Capsular glaucoma with pseudoexfoliation of lens, left eye, moderate stage: Secondary | ICD-10-CM | POA: Diagnosis not present

## 2017-05-21 DIAGNOSIS — H401411 Capsular glaucoma with pseudoexfoliation of lens, right eye, mild stage: Secondary | ICD-10-CM | POA: Diagnosis not present

## 2017-05-21 DIAGNOSIS — H401492 Capsular glaucoma with pseudoexfoliation of lens, unspecified eye, moderate stage: Secondary | ICD-10-CM | POA: Diagnosis not present

## 2017-06-23 DIAGNOSIS — H401492 Capsular glaucoma with pseudoexfoliation of lens, unspecified eye, moderate stage: Secondary | ICD-10-CM | POA: Diagnosis not present

## 2017-07-15 DIAGNOSIS — Z6836 Body mass index (BMI) 36.0-36.9, adult: Secondary | ICD-10-CM | POA: Diagnosis not present

## 2017-07-15 DIAGNOSIS — R5383 Other fatigue: Secondary | ICD-10-CM | POA: Diagnosis not present

## 2017-07-15 DIAGNOSIS — I10 Essential (primary) hypertension: Secondary | ICD-10-CM | POA: Diagnosis not present

## 2017-07-15 DIAGNOSIS — Z23 Encounter for immunization: Secondary | ICD-10-CM | POA: Diagnosis not present

## 2017-08-14 ENCOUNTER — Ambulatory Visit (INDEPENDENT_AMBULATORY_CARE_PROVIDER_SITE_OTHER): Payer: Medicare Other | Admitting: Cardiovascular Disease

## 2017-08-14 ENCOUNTER — Encounter: Payer: Self-pay | Admitting: Cardiovascular Disease

## 2017-08-14 VITALS — BP 100/70 | HR 54 | Ht 65.0 in | Wt 214.8 lb

## 2017-08-14 DIAGNOSIS — R0602 Shortness of breath: Secondary | ICD-10-CM | POA: Diagnosis not present

## 2017-08-14 DIAGNOSIS — R001 Bradycardia, unspecified: Secondary | ICD-10-CM

## 2017-08-14 NOTE — Patient Instructions (Addendum)
Medication Instructions:  Your physician recommends that you continue on your current medications as directed. Please refer to the Current Medication list given to you today.   Labwork: none  Testing/Procedures: Your physician has requested that you have an echocardiogram. Echocardiography is a painless test that uses sound waves to create images of your heart. It provides your doctor with information about the size and shape of your heart and how well your heart's chambers and valves are working. This procedure takes approximately one hour. There are no restrictions for this procedure.    Follow-Up: Your physician wants you to follow-up in: 6 months with Dr. Arida.  You will receive a reminder letter in the mail two months in advance. If you don't receive a letter, please call our office to schedule the follow-up appointment.   Any Other Special Instructions Will Be Listed Below (If Applicable).     If you need a refill on your cardiac medications before your next appointment, please call your pharmacy.  Echocardiogram An echocardiogram, or echocardiography, uses sound waves (ultrasound) to produce an image of your heart. The echocardiogram is simple, painless, obtained within a short period of time, and offers valuable information to your health care provider. The images from an echocardiogram can provide information such as:  Evidence of coronary artery disease (CAD).  Heart size.  Heart muscle function.  Heart valve function.  Aneurysm detection.  Evidence of a past heart attack.  Fluid buildup around the heart.  Heart muscle thickening.  Assess heart valve function.  Tell a health care provider about:  Any allergies you have.  All medicines you are taking, including vitamins, herbs, eye drops, creams, and over-the-counter medicines.  Any problems you or family members have had with anesthetic medicines.  Any blood disorders you have.  Any surgeries you have  had.  Any medical conditions you have.  Whether you are pregnant or may be pregnant. What happens before the procedure? No special preparation is needed. Eat and drink normally. What happens during the procedure?  In order to produce an image of your heart, gel will be applied to your chest and a wand-like tool (transducer) will be moved over your chest. The gel will help transmit the sound waves from the transducer. The sound waves will harmlessly bounce off your heart to allow the heart images to be captured in real-time motion. These images will then be recorded.  You may need an IV to receive a medicine that improves the quality of the pictures. What happens after the procedure? You may return to your normal schedule including diet, activities, and medicines, unless your health care provider tells you otherwise. This information is not intended to replace advice given to you by your health care provider. Make sure you discuss any questions you have with your health care provider. Document Released: 09/12/2000 Document Revised: 05/03/2016 Document Reviewed: 05/23/2013 Elsevier Interactive Patient Education  2017 Elsevier Inc.  

## 2017-08-14 NOTE — Progress Notes (Signed)
Cardiology Office Note   Date:  08/14/2017   ID:  Isabella Hurst Isabella Hurst, DOB 19-Jan-1946, MRN [id number]  PCP:  Ronnell Freshwater, NP  Cardiologist:   Kathlyn Sacramento, MD   Chief Complaint  Patient presents with  . other    2nd opinion c/o sob. Meds reviewed verbally with pt.      History of Present Illness: Isabella Hurst is a 71 y.o. female who presents for a second opinion regarding shortness of breath and bradycardia.  She reports having slow heartbeat for many years but no other prior cardiac history.  She has known history of essential hypertension, depression and chronic back pain.  She reports having pulmonary evaluation before which was unremarkable.  She is not a smoker.  She has no family history of premature coronary artery disease although her mother did have myocardial infarction in her 70s. The patient complained of dizziness earlier this year and was noted to be bradycardic with a heart rate of 47 bpm.  She was referred to Dr. Nehemiah Massed.  She underwent a treadmill stress test but was not able to get her heart rate up with exercise.  Thus, she underwent a [place] which was normal.  She had a monitor done which showed some PVCs and sinus bradycardia.  Bradycardia was noted but it was felt that it was not severe enough to require pacemaker.  The patient never had syncope.  She complains of exertional dyspnea with regular activities without chest discomfort.  No orthopnea, PND or leg edema. Interestingly, her fatigue and dizziness resolved after she stopped taking her timolol eyedrops.    Past Medical History:  Diagnosis Date  . Arthritis    feet  . Bulging lumbar disc    also, herniated disc  . Colon polyps   . Complication of anesthesia    c-section - saw fast moving, flashing lights through sedation.  NO issues since  . Depression    Dr. Lyndle Herrlich from [place]  . GERD (gastroesophageal reflux disease)   . Heart murmur    As child  . Hypertension   .  Phlebitis   . Rosacea   . Varicose vein     Past Surgical History:  Procedure Laterality Date  . ABDOMINAL HYSTERECTOMY  1994   complete for fibroids  . CATARACT EXTRACTION PHACO AND INTRAOCULAR LENS PLACEMENT ([place]) Right 08/01/2015   Performed by Leandrew Koyanagi, MD at [place]  . CATARACT EXTRACTION PHACO AND INTRAOCULAR LENS PLACEMENT ([place]) Left 07/04/2015   Performed by Leandrew Koyanagi, MD at [place]  . CESAREAN SECTION     2  . GASTRIC BYPASS  1980  . HERNIA REPAIR  1995   incisional hernia     Current Outpatient Medications  Medication Sig Dispense Refill  . aspirin EC 81 MG tablet Take 81 mg daily by mouth.    . brimonidine (ALPHAGAN P) 0.1 % SOLN Place 1 drop into both eyes 2 (two) times daily.    . Calcium Carbonate-Vitamin D (CALCIUM 600+D) 600-400 MG-UNIT per tablet Take 1 tablet by mouth daily.    . Cholecalciferol (VITAMIN D3) 1000 UNITS CAPS Take 2,000 Units by mouth.     . Cyanocobalamin 1000 MCG TBCR Take 2,000 mcg by mouth.     . dextromethorphan (DELSYM) 30 MG/5ML liquid Take 5 mLs (30 mg total) by mouth 2 (two) times daily. 89 mL 0  . doxycycline (VIBRA-TABS) 100 MG tablet TAKE 1 TABLET (100 MG TOTAL) BY MOUTH 2 (  TWO) TIMES DAILY. 60 tablet 2  . hydrochlorothiazide (HYDRODIURIL) 25 MG tablet TAKE 0.5 TABLETS (12.5 MG TOTAL) BY MOUTH DAILY. 30 tablet 2  . lamoTRIgine (LAMICTAL) 150 MG tablet Take 1 tablet (150 mg total) by mouth daily. 90 tablet 3  . latanoprost (XALATAN) 0.005 % ophthalmic solution Place 1 drop at bedtime into both eyes.    Vladimir Faster Glycol-Propyl Glycol (SYSTANE OP) Apply to eye.    . predniSONE (DELTASONE) 20 MG tablet Take 1 tablet (20 mg total) by mouth daily with breakfast. 5 tablet 0  . sertraline (ZOLOFT) 100 MG tablet Take 100 mg by mouth daily.  2  . traZODone (DESYREL) 50 MG tablet Take 50 mg at bedtime by mouth.     No current facility-administered medications for this visit.     Allergies:    Quinolones; Beta adrenergic blockers; Clindamycin/lincomycin; Demerol [meperidine]; and Tape    Social History:  The patient  reports that  has never smoked. she has never used smokeless tobacco. She reports that she does not drink alcohol or use drugs.   Family History:  The patient's family history includes Cancer in her father; Heart attack in her mother; Heart disease in her mother; Heart failure in her mother.    ROS:  Please see the history of present illness.   Otherwise, review of systems are positive for none.   All other systems are reviewed and negative.    PHYSICAL EXAM: VS:  BP 100/70 (BP Location: Left Arm, Patient Position: Sitting, Cuff Size: Large)   Pulse (!) 54   Ht 5\' 5"  (1.651 m)   Wt 214 lb 12 oz (97.4 kg)   BMI 35.74 kg/m  , BMI Body mass index is 35.74 kg/m. GEN: Well nourished, well developed, in no acute distress  HEENT: normal  Neck: no JVD, carotid bruits, or masses Cardiac: RRR; no murmurs, rubs, or gallops,no edema  Respiratory:  clear to auscultation bilaterally, normal work of breathing GI: soft, nontender, nondistended, + BS MS: no deformity or atrophy  Skin: warm and dry, no rash Neuro:  Strength and sensation are intact Psych: euthymic mood, full affect   EKG:  EKG is ordered today. The ekg ordered today demonstrates sinus bradycardia with no significant ST or T wave changes.   Recent Labs: No results found for requested labs within last 8760 hours.    Lipid Panel    Component Value Date/Time   CHOL 230 (H) 08/30/2015 1131   TRIG 82.0 08/30/2015 1131   HDL 66.50 08/30/2015 1131   CHOLHDL 3 08/30/2015 1131   VLDL 16.4 08/30/2015 1131   LDLCALC 147 (H) 08/30/2015 1131   LDLDIRECT 128.1 06/09/2013 0933      Wt Readings from Last 3 Encounters:  08/14/17 214 lb 12 oz (97.4 kg)  03/11/16 203 lb (92.1 kg)  11/13/15 190 lb 8 oz (86.4 kg)      PAD Screen 08/14/2017  Previous PAD dx? No  Previous surgical procedure? No  Pain with  walking? No  Feet/toe relief with dangling? No  Painful, non-healing ulcers? No  Extremities discolored? No      ASSESSMENT AND PLAN:  1.  Sinus bradycardia: Her symptoms improved after stopping timolol eyedrops.  She does not have much symptoms at the present time and there is currently no indication for permanent pacemaker.  Her bradycardia is not severe enough but should be monitored. Certainly, chronotropic incompetence can be an indication for permanent pacemaker placement.  However, her physical capacity is limited  by chronic back pain and I doubt that a permanent pacemaker would make a difference at this time with her functioning capacity.  2.  Exertional dyspnea: She had a nuclear stress test done earlier this year which was unremarkable.  She denies any chest discomfort.  I suspect that a lot of her symptoms are related to physical deconditioning but I am going to obtain an echocardiogram to evaluate ejection fraction, diastolic function and pulmonary pressure.    Disposition:   FU with me in 6 months  Signed,  Kathlyn Sacramento, MD  08/14/2017 11:20 AM    [place]

## 2017-08-27 ENCOUNTER — Ambulatory Visit (INDEPENDENT_AMBULATORY_CARE_PROVIDER_SITE_OTHER): Payer: Medicare Other

## 2017-08-27 ENCOUNTER — Other Ambulatory Visit: Payer: Self-pay

## 2017-08-27 DIAGNOSIS — R0602 Shortness of breath: Secondary | ICD-10-CM

## 2017-09-15 ENCOUNTER — Ambulatory Visit: Payer: Medicare Other | Admitting: Cardiovascular Disease

## 2017-10-06 DIAGNOSIS — L720 Epidermal cyst: Secondary | ICD-10-CM | POA: Diagnosis not present

## 2017-11-12 ENCOUNTER — Ambulatory Visit: Payer: Self-pay | Admitting: Nurse Practitioner

## 2017-12-11 ENCOUNTER — Encounter: Payer: Self-pay | Admitting: Nurse Practitioner

## 2017-12-11 ENCOUNTER — Ambulatory Visit (INDEPENDENT_AMBULATORY_CARE_PROVIDER_SITE_OTHER): Payer: Medicare Other | Admitting: Nurse Practitioner

## 2017-12-11 VITALS — BP 140/80 | HR 54 | Resp 16 | Ht 65.0 in | Wt 207.4 lb

## 2017-12-11 DIAGNOSIS — F3341 Major depressive disorder, recurrent, in partial remission: Secondary | ICD-10-CM

## 2017-12-11 DIAGNOSIS — I10 Essential (primary) hypertension: Secondary | ICD-10-CM | POA: Diagnosis not present

## 2017-12-11 DIAGNOSIS — E538 Deficiency of other specified B group vitamins: Secondary | ICD-10-CM

## 2017-12-11 DIAGNOSIS — J309 Allergic rhinitis, unspecified: Secondary | ICD-10-CM | POA: Diagnosis not present

## 2017-12-11 DIAGNOSIS — E559 Vitamin D deficiency, unspecified: Secondary | ICD-10-CM | POA: Diagnosis not present

## 2017-12-11 MED ORDER — PREDNISONE 5 MG (21) PO TBPK: ORAL_TABLET | ORAL | 0 refills | Status: DC

## 2017-12-11 MED ORDER — CYANOCOBALAMIN ER 1000 MCG PO TBCR: 2000.0000 ug | EXTENDED_RELEASE_TABLET | Freq: Every day | ORAL | 5 refills | Status: AC

## 2017-12-11 MED ORDER — VITAMIN D3 25 MCG (1000 UT) PO CAPS: 2000.0000 [IU] | ORAL_CAPSULE | Freq: Every day | ORAL | 5 refills | Status: AC

## 2017-12-11 NOTE — Progress Notes (Signed)
[facility] [address], [place] [address]  Internal MEDICINE  Office Visit Note  Patient Name: Isabella Hurst  [id number]  [id number]  Date of Service: 01/03/2018  Chief Complaint  Patient presents with  . Hypertension    The patient is here for routine follow up visit. She is treated for hypertension  And her blood pressure is well controlled. She has no chest pain, chest pressure, or shortness of breath. She has no new concerns or complaints today.    Pt is here for routine follow up.    Current Medication: Outpatient Encounter Medications as of 12/11/2017  Medication Sig Note  . aspirin EC 81 MG tablet Take 81 mg daily by mouth.   . brimonidine (ALPHAGAN P) 0.1 % SOLN Place 1 drop into both eyes 2 (two) times daily.   . Cholecalciferol (VITAMIN D3) 1000 units CAPS Take 2 capsules (2,000 Units total) by mouth daily.   . Cyanocobalamin 1000 MCG TBCR Take 2 tablets (2,000 mcg total) by mouth daily.   Marland Kitchen doxycycline (VIBRA-TABS) 100 MG tablet TAKE 1 TABLET (100 MG TOTAL) BY MOUTH 2 (TWO) TIMES DAILY. 08/14/2017: PRN   . hydrochlorothiazide (HYDRODIURIL) 25 MG tablet TAKE 0.5 TABLETS (12.5 MG TOTAL) BY MOUTH DAILY.   Marland Kitchen lamoTRIgine (LAMICTAL) 150 MG tablet Take 1 tablet (150 mg total) by mouth daily.   Marland Kitchen latanoprost (XALATAN) 0.005 % ophthalmic solution Place 1 drop at bedtime into both eyes.   Vladimir Faster Glycol-Propyl Glycol (SYSTANE OP) Apply to eye.   . sertraline (ZOLOFT) 100 MG tablet Take 100 mg by mouth daily. 08/15/2014: Received from: External Pharmacy Received Sig:   . traZODone (DESYREL) 50 MG tablet Take 50 mg at bedtime by mouth.   . [DISCONTINUED] Cholecalciferol (VITAMIN D3) 1000 UNITS CAPS Take 2,000 Units by mouth.  11/16/2014: Received from: [facility]  . [DISCONTINUED] Cyanocobalamin 1000 MCG TBCR Take 2,000 mcg by mouth.  11/16/2014: Received from: [facility]  . Calcium Carbonate-Vitamin D (CALCIUM 600+D)  600-400 MG-UNIT per tablet Take 1 tablet by mouth daily.   Marland Kitchen dextromethorphan (DELSYM) 30 MG/5ML liquid Take 5 mLs (30 mg total) by mouth 2 (two) times daily. (Patient not taking: Reported on 12/11/2017)   . predniSONE (STERAPRED UNI-PAK 21 TAB) 5 MG (21) TBPK tablet 6 day taper - take as directed for 6 days   . [DISCONTINUED] predniSONE (DELTASONE) 20 MG tablet Take 1 tablet (20 mg total) by mouth daily with breakfast. (Patient not taking: Reported on 12/11/2017)    No facility-administered encounter medications on file as of 12/11/2017.     Surgical History: Past Surgical History:  Procedure Laterality Date  . ABDOMINAL HYSTERECTOMY  1994   complete for fibroids  . CATARACT EXTRACTION W/PHACO Left 07/04/2015   Procedure: CATARACT EXTRACTION PHACO AND INTRAOCULAR LENS PLACEMENT (IOC);  Surgeon: Leandrew Koyanagi, MD;  Location: [facility];  Service: Ophthalmology;  Laterality: Left;  . CATARACT EXTRACTION W/PHACO Right 08/01/2015   Procedure: CATARACT EXTRACTION PHACO AND INTRAOCULAR LENS PLACEMENT (IOC);  Surgeon: Leandrew Koyanagi, MD;  Location: [facility];  Service: Ophthalmology;  Laterality: Right;  . CESAREAN SECTION     2  . GASTRIC BYPASS  1980  . HERNIA REPAIR  1995   incisional hernia    Medical History: Past Medical History:  Diagnosis Date  . Anxiety   . Arthritis    feet  . Bulging lumbar disc    also, herniated disc  . Colon polyps   .  Complication of anesthesia    c-section - saw fast moving, flashing lights through sedation.  NO issues since  . Depression    Dr. Lyndle Herrlich from [place]  . GERD (gastroesophageal reflux disease)   . Heart murmur    As child  . Hypertension   . Phlebitis   . Rosacea   . Varicose vein     Family History: Family History  Problem Relation Age of Onset  . Heart disease Mother   . Heart attack Mother   . Heart failure Mother   . Cancer Father        Lung cancer    Social History   Socioeconomic History   . Marital status: Divorced    Spouse name: Not on file  . Number of children: Not on file  . Years of education: Not on file  . Highest education level: Not on file  Occupational History  . Not on file  Social Needs  . Financial resource strain: Not on file  . Food insecurity:    Worry: Not on file    Inability: Not on file  . Transportation needs:    Medical: Not on file    Non-medical: Not on file  Tobacco Use  . Smoking status: Never Smoker  . Smokeless tobacco: Never Used  Substance and Sexual Activity  . Alcohol use: No    Alcohol/week: 0.0 oz    Comment: wine, 3-4 times /year  . Drug use: No  . Sexual activity: Not on file  Lifestyle  . Physical activity:    Days per week: Not on file    Minutes per session: Not on file  . Stress: Not on file  Relationships  . Social connections:    Talks on phone: Not on file    Gets together: Not on file    Attends religious service: Not on file    Active member of club or organization: Not on file    Attends meetings of clubs or organizations: Not on file    Relationship status: Not on file  . Intimate partner violence:    Fear of current or ex partner: Not on file    Emotionally abused: Not on file    Physically abused: Not on file    Forced sexual activity: Not on file  Other Topics Concern  . Not on file  Social History Narrative   Lives in [place]. Brother lives with her. From [place]. Widow, then divorced. 2 daughters, on deceased.      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      Diet - regular diet, no desserts, previously gluten free      Exercise - started walking program, daily      Review of Systems  Constitutional: Negative for activity change, chills, fatigue and unexpected weight change.  HENT: Negative for congestion, postnasal drip, rhinorrhea, sneezing and sore throat.   Eyes: Negative.  Negative for redness.  Respiratory: Negative for cough, chest tightness, shortness of breath and wheezing.    Cardiovascular: Negative for chest pain and palpitations.  Gastrointestinal: Negative for abdominal pain, constipation, diarrhea, nausea and vomiting.  Endocrine: Negative for cold intolerance, heat intolerance, polydipsia, polyphagia and polyuria.  Genitourinary: Negative.  Negative for dysuria and frequency.  Musculoskeletal: Negative for arthralgias, back pain, joint swelling, myalgias and neck pain.  Skin: Negative for rash.  Allergic/Immunologic: Negative for environmental allergies.  Neurological: Positive for dizziness. Negative for tremors, numbness and headaches.  Hematological: Negative for adenopathy. Does  not bruise/bleed easily.  Psychiatric/Behavioral: Negative for behavioral problems (Depression), sleep disturbance and suicidal ideas. The patient is nervous/anxious.     Vital Signs: BP 140/80   Pulse (!) 54   Resp 16   Ht 5\' 5"  (1.651 m)   Wt 207 lb 6.4 oz (94.1 kg)   SpO2 97%   BMI 34.51 kg/m    Physical Exam  Constitutional: She is oriented to person, place, and time. She appears well-developed and well-nourished. No distress.  HENT:  Head: Normocephalic and atraumatic.  Mouth/Throat: Oropharynx is clear and moist. No oropharyngeal exudate.  Eyes: Pupils are equal, round, and reactive to light. EOM are normal.  Neck: Normal range of motion. Neck supple. No JVD present. No tracheal deviation present. No thyromegaly present.  Cardiovascular: Normal rate, regular rhythm and normal heart sounds. Exam reveals no gallop and no friction rub.  No murmur heard. Pulmonary/Chest: Effort normal and breath sounds normal. No respiratory distress. She has no wheezes. She has no rales. She exhibits no tenderness.  Abdominal: Soft. Bowel sounds are normal. There is no tenderness.  Musculoskeletal: Normal range of motion.  Lymphadenopathy:    She has no cervical adenopathy.  Neurological: She is alert and oriented to person, place, and time. No cranial nerve deficit.  Skin:  Skin is warm and dry. She is not diaphoretic.  Psychiatric: Her behavior is normal. Judgment and thought content normal. Her mood appears anxious.  Nursing note and vitals reviewed.  Assessment/Plan: 1. Essential hypertension, benign Stable. Continue bp medication as prescribed   2. Vitamin B12 deficiency - Cyanocobalamin 1000 MCG TBCR; Take 2 tablets (2,000 mcg total) by mouth daily.  Dispense: 60 tablet; Refill: 5  3. Vitamin D deficiency - Cholecalciferol (VITAMIN D3) 1000 units CAPS; Take 2 capsules (2,000 Units total) by mouth daily.  Dispense: 60 capsule; Refill: 5  4. Allergic rhinitis, unspecified seasonality, unspecified trigger OTC anti-histamine, such as clairitin or cetirizine daily.   5. Recurrent major depressive disorder, in partial remission ([place]) conitnue regular visits with psychiatry as scheduled.   General Counseling: Melody verbalizes understanding of the findings of todays visit and agrees with plan of treatment. I have discussed any further diagnostic evaluation that may be needed or ordered today. We also reviewed her medications today. she has been encouraged to call the office with any questions or concerns that should arise related to todays visit.  This patient was seen by Leretha Pol, FNP- C in Collaboration with Dr Lavera Guise as a part of collaborative care agreement     Meds ordered this encounter  Medications  . Cholecalciferol (VITAMIN D3) 1000 units CAPS    Sig: Take 2 capsules (2,000 Units total) by mouth daily.    Dispense:  60 capsule    Refill:  5    Order Specific Question:   Supervising Provider    Answer:   Lavera Guise [[id number]]  . Cyanocobalamin 1000 MCG TBCR    Sig: Take 2 tablets (2,000 mcg total) by mouth daily.    Dispense:  60 tablet    Refill:  5    Order Specific Question:   Supervising Provider    Answer:   Lavera Guise [[id number]]  . predniSONE (STERAPRED UNI-PAK 21 TAB) 5 MG (21) TBPK tablet    Sig: 6 day taper - take as  directed for 6 days    Dispense:  21 tablet    Refill:  0    Order Specific Question:   Supervising Provider  AnswerLavera Guise [[id number]]    Time spent: 12 Minutes          Dr Lavera Guise Internal medicine

## 2017-12-21 DIAGNOSIS — H401492 Capsular glaucoma with pseudoexfoliation of lens, unspecified eye, moderate stage: Secondary | ICD-10-CM | POA: Diagnosis not present

## 2018-01-03 DIAGNOSIS — E538 Deficiency of other specified B group vitamins: Secondary | ICD-10-CM | POA: Insufficient documentation

## 2018-01-03 DIAGNOSIS — E559 Vitamin D deficiency, unspecified: Secondary | ICD-10-CM | POA: Insufficient documentation

## 2018-04-13 ENCOUNTER — Other Ambulatory Visit: Payer: Self-pay | Admitting: Nurse Practitioner

## 2018-04-13 ENCOUNTER — Encounter: Payer: Self-pay | Admitting: Nurse Practitioner

## 2018-04-13 ENCOUNTER — Ambulatory Visit (INDEPENDENT_AMBULATORY_CARE_PROVIDER_SITE_OTHER): Payer: Medicare Other | Admitting: Nurse Practitioner

## 2018-04-13 VITALS — BP 127/67 | HR 53 | Resp 16 | Ht 65.0 in | Wt 205.2 lb

## 2018-04-13 DIAGNOSIS — Z1231 Encounter for screening mammogram for malignant neoplasm of breast: Secondary | ICD-10-CM

## 2018-04-13 DIAGNOSIS — E538 Deficiency of other specified B group vitamins: Secondary | ICD-10-CM

## 2018-04-13 DIAGNOSIS — I10 Essential (primary) hypertension: Secondary | ICD-10-CM | POA: Diagnosis not present

## 2018-04-13 DIAGNOSIS — E559 Vitamin D deficiency, unspecified: Secondary | ICD-10-CM | POA: Diagnosis not present

## 2018-04-13 DIAGNOSIS — R7301 Impaired fasting glucose: Secondary | ICD-10-CM | POA: Diagnosis not present

## 2018-04-13 DIAGNOSIS — Z1239 Encounter for other screening for malignant neoplasm of breast: Secondary | ICD-10-CM

## 2018-04-13 DIAGNOSIS — Z0001 Encounter for general adult medical examination with abnormal findings: Secondary | ICD-10-CM | POA: Diagnosis not present

## 2018-04-13 DIAGNOSIS — R300 Dysuria: Secondary | ICD-10-CM

## 2018-04-13 NOTE — Progress Notes (Signed)
[facility] [place], [place] [address]  Internal MEDICINE  Office Visit Note  Patient Name: Isabella Hurst  [id number]  [id number]  Date of Service: 04/14/2018   Pt is here for routine health maintenance examination   Chief Complaint  Patient presents with  . Annual Exam  . Hypertension    well controlled      Hypertension  This is a chronic problem. The current episode started more than 1 year ago. The problem is unchanged. The problem is controlled. Pertinent negatives include no chest pain, headaches, neck pain, palpitations or shortness of breath. There are no associated agents to hypertension. Risk factors for coronary artery disease include dyslipidemia and post-menopausal state. Past treatments include diuretics. The current treatment provides moderate improvement. There are no compliance problems.      Current Medication: Outpatient Encounter Medications as of 04/13/2018  Medication Sig Note  . aspirin EC 81 MG tablet Take 81 mg daily by mouth.   . brimonidine (ALPHAGAN P) 0.1 % SOLN Place 1 drop into both eyes 2 (two) times daily.   . Calcium Carbonate-Vitamin D (CALCIUM 600+D) 600-400 MG-UNIT per tablet Take 1 tablet by mouth daily.   . Cholecalciferol (VITAMIN D3) 1000 units CAPS Take 2 capsules (2,000 Units total) by mouth daily.   . Cyanocobalamin 1000 MCG TBCR Take 2 tablets (2,000 mcg total) by mouth daily.   Marland Kitchen doxycycline (VIBRA-TABS) 100 MG tablet TAKE 1 TABLET (100 MG TOTAL) BY MOUTH 2 (TWO) TIMES DAILY. 08/14/2017: PRN   . hydrochlorothiazide (HYDRODIURIL) 25 MG tablet TAKE 0.5 TABLETS (12.5 MG TOTAL) BY MOUTH DAILY.   Marland Kitchen lamoTRIgine (LAMICTAL) 150 MG tablet Take 1 tablet (150 mg total) by mouth daily.   Marland Kitchen latanoprost (XALATAN) 0.005 % ophthalmic solution Place 1 drop at bedtime into both eyes.   Vladimir Faster Glycol-Propyl Glycol (SYSTANE OP) Apply to eye.   . sertraline (ZOLOFT) 100 MG tablet Take 100 mg by mouth daily. 08/15/2014:  Received from: External Pharmacy Received Sig:   . traZODone (DESYREL) 50 MG tablet Take 50 mg at bedtime by mouth.   . [DISCONTINUED] predniSONE (STERAPRED UNI-PAK 21 TAB) 5 MG (21) TBPK tablet 6 day taper - take as directed for 6 days   . dextromethorphan (DELSYM) 30 MG/5ML liquid Take 5 mLs (30 mg total) by mouth 2 (two) times daily. (Patient not taking: Reported on 12/11/2017)    No facility-administered encounter medications on file as of 04/13/2018.     Surgical History: Past Surgical History:  Procedure Laterality Date  . ABDOMINAL HYSTERECTOMY  1994   complete for fibroids  . CATARACT EXTRACTION W/PHACO Left 07/04/2015   Procedure: CATARACT EXTRACTION PHACO AND INTRAOCULAR LENS PLACEMENT (IOC);  Surgeon: Leandrew Koyanagi, MD;  Location: [facility];  Service: Ophthalmology;  Laterality: Left;  . CATARACT EXTRACTION W/PHACO Right 08/01/2015   Procedure: CATARACT EXTRACTION PHACO AND INTRAOCULAR LENS PLACEMENT (IOC);  Surgeon: Leandrew Koyanagi, MD;  Location: [facility];  Service: Ophthalmology;  Laterality: Right;  . CESAREAN SECTION     2  . GASTRIC BYPASS  1980  . HERNIA REPAIR  1995   incisional hernia    Medical History: Past Medical History:  Diagnosis Date  . Anxiety   . Arthritis    feet  . Bulging lumbar disc    also, herniated disc  . Colon polyps   . Complication of anesthesia    c-section - saw fast moving, flashing lights through sedation.  NO issues since  .  Depression    Dr. Lyndle Herrlich from [place]  . GERD (gastroesophageal reflux disease)   . Heart murmur    As child  . Hypertension   . Phlebitis   . Rosacea   . Varicose vein     Family History: Family History  Problem Relation Age of Onset  . Heart disease Mother   . Heart attack Mother   . Heart failure Mother   . Cancer Father        Lung cancer      Review of Systems  Constitutional: Negative for activity change, chills, fatigue and unexpected weight change.  HENT:  Negative for congestion, postnasal drip, rhinorrhea, sneezing and sore throat.   Eyes: Negative.  Negative for redness.  Respiratory: Negative for cough, chest tightness, shortness of breath and wheezing.   Cardiovascular: Negative for chest pain and palpitations.  Gastrointestinal: Negative for abdominal pain, constipation, diarrhea, nausea and vomiting.  Endocrine: Negative for cold intolerance, heat intolerance, polydipsia, polyphagia and polyuria.  Genitourinary: Negative.  Negative for dysuria and frequency.  Musculoskeletal: Negative for arthralgias, back pain, joint swelling, myalgias and neck pain.  Skin: Negative for rash.  Allergic/Immunologic: Negative for environmental allergies.  Neurological: Positive for dizziness. Negative for tremors, numbness and headaches.  Hematological: Negative for adenopathy. Does not bruise/bleed easily.  Psychiatric/Behavioral: Positive for dysphoric mood. Negative for behavioral problems (Depression), sleep disturbance and suicidal ideas. The patient is nervous/anxious.        Sees psychiatry on regular basis.      Vital Signs: BP 127/67   Pulse (!) 53   Resp 16   Ht 5\' 5"  (1.651 m)   Wt 205 lb 3.2 oz (93.1 kg)   SpO2 97%   BMI 34.15 kg/m    Physical Exam  Constitutional: She is oriented to person, place, and time. She appears well-developed and well-nourished. No distress.  HENT:  Head: Normocephalic and atraumatic.  Nose: Nose normal.  Mouth/Throat: Oropharynx is clear and moist. No oropharyngeal exudate.  Eyes: Pupils are equal, round, and reactive to light. Conjunctivae and EOM are normal.  Neck: Normal range of motion. Neck supple. No JVD present. Carotid bruit is not present. No tracheal deviation present. No thyromegaly present.  Cardiovascular: Normal rate, regular rhythm, normal heart sounds and intact distal pulses. Exam reveals no gallop and no friction rub.  No murmur heard. Pulmonary/Chest: Effort normal and breath sounds  normal. No respiratory distress. She has no wheezes. She has no rales. She exhibits no tenderness. Right breast exhibits no inverted nipple, no mass, no nipple discharge, no skin change and no tenderness. Left breast exhibits no inverted nipple, no mass, no nipple discharge, no skin change and no tenderness.  Abdominal: Soft. Bowel sounds are normal. There is no tenderness.  Musculoskeletal: Normal range of motion.  Lymphadenopathy:    She has no cervical adenopathy.  Neurological: She is alert and oriented to person, place, and time. No cranial nerve deficit.  The patient is at neurological baseline.  Skin: Skin is warm and dry. Capillary refill takes less than 2 seconds. She is not diaphoretic.  Psychiatric: Her behavior is normal. Judgment and thought content normal. Her mood appears anxious.  Nursing note and vitals reviewed.  Assessment/Plan:  1. Encounter for general adult medical examination with abnormal findings Annual health maintenance exam today. Routine, fasting labs ordered.  - UA/M w/rflx Culture, Routine - CBC with Differential/Platelet - Comprehensive metabolic panel - T4, free  2. Essential hypertension, benign Well controlled.  - CBC with  Differential/Platelet - Comprehensive metabolic panel - T4, free - TSH - Lipid panel  3. Vitamin D deficiency - Vitamin D 1,25 dihydroxy  4. Vitamin B12 deficiency - B12  5. Impaired fasting glucose - HgB A1c  6. Screening for breast cancer - MM DIGITAL SCREENING BILATERAL; Future   General Counseling: Jadee verbalizes understanding of the findings of todays visit and agrees with plan of treatment. I have discussed any further diagnostic evaluation that may be needed or ordered today. We also reviewed her medications today. she has been encouraged to call the office with any questions or concerns that should arise related to todays visit.    Counseling:  This patient was seen by Leretha Pol FNP Collaboration with  Dr Lavera Guise as a part of collaborative care agreement  Orders Placed This Encounter  Procedures  . Microscopic Examination  . Urine Culture, Reflex  . MM DIGITAL SCREENING BILATERAL  . UA/M w/rflx Culture, Routine  . CBC with Differential/Platelet  . Comprehensive metabolic panel  . T4, free  . TSH  . Lipid panel  . Vitamin D 1,25 dihydroxy  . HgB A1c  . B12  . Specimen status report      Time spent: [place], MD  Internal Medicine

## 2018-04-14 DIAGNOSIS — R7301 Impaired fasting glucose: Secondary | ICD-10-CM | POA: Insufficient documentation

## 2018-04-14 LAB — HGB A1C W/O EAG: HEMOGLOBIN A1C: 6.1 % — AB (ref 4.8–5.6)

## 2018-04-14 LAB — B12 AND FOLATE PANEL
FOLATE: 6.4 ng/mL (ref >3.0)
Vitamin B-12: >2000 pg/mL — ABNORMAL HIGH (ref 232–1245)

## 2018-04-14 LAB — COMPREHENSIVE METABOLIC PANEL
ALT: 9 IU/L (ref 0–32)
AST: 11 IU/L (ref 0–40)
Albumin/Globulin Ratio: 2 (ref 1.2–2.2)
Albumin: 4.3 g/dL (ref 3.5–4.8)
Alkaline Phosphatase: 82 IU/L (ref 39–117)
BUN / CREAT RATIO: 36 — AB (ref 12–28)
BUN: 25 mg/dL (ref 8–27)
Bilirubin Total: 0.3 mg/dL (ref 0.0–1.2)
CO2: 21 mmol/L (ref 20–29)
Calcium: 8.8 mg/dL (ref 8.7–10.3)
Chloride: 99 mmol/L (ref 96–106)
Creatinine, Ser: 0.7 mg/dL (ref 0.57–1.00)
GFR calc non Af Amer: 87 mL/min/{1.73_m2} (ref >59)
GFR, EST AFRICAN AMERICAN: 101 mL/min/{1.73_m2} (ref >59)
GLOBULIN, TOTAL: 2.2 g/dL (ref 1.5–4.5)
Glucose: 119 mg/dL — ABNORMAL HIGH (ref 65–99)
POTASSIUM: 4.1 mmol/L (ref 3.5–5.2)
SODIUM: 139 mmol/L (ref 134–144)
Total Protein: 6.5 g/dL (ref 6.0–8.5)

## 2018-04-14 LAB — CBC
Hematocrit: 43.7 % (ref 34.0–46.6)
Hemoglobin: 14.9 g/dL (ref 11.1–15.9)
MCH: 30.7 pg (ref 26.6–33.0)
MCHC: 34.1 g/dL (ref 31.5–35.7)
MCV: 90 fL (ref 79–97)
PLATELETS: 284 10*3/uL (ref 150–450)
RBC: 4.85 x10E6/uL (ref 3.77–5.28)
RDW: 13.3 % (ref 12.3–15.4)
WBC: 5.7 10*3/uL (ref 3.4–10.8)

## 2018-04-14 LAB — LIPID PANEL W/O CHOL/HDL RATIO
CHOLESTEROL TOTAL: 224 mg/dL — AB (ref 100–199)
HDL: 52 mg/dL (ref >39)
LDL Calculated: 153 mg/dL — ABNORMAL HIGH (ref 0–99)
Triglycerides: 93 mg/dL (ref 0–149)
VLDL Cholesterol Cal: 19 mg/dL (ref 5–40)

## 2018-04-14 LAB — T4, FREE: FREE T4: 1.79 ng/dL — AB (ref 0.82–1.77)

## 2018-04-14 LAB — TSH: TSH: 3.63 u[IU]/mL (ref 0.450–4.500)

## 2018-04-14 LAB — SPECIMEN STATUS REPORT

## 2018-04-14 LAB — VITAMIN D 25 HYDROXY (VIT D DEFICIENCY, FRACTURES): VIT D 25 HYDROXY: 23.9 ng/mL — AB (ref 30.0–100.0)

## 2018-04-17 LAB — MICROSCOPIC EXAMINATION: BACTERIA UA: NONE SEEN

## 2018-04-17 LAB — UA/M W/RFLX CULTURE, ROUTINE
BILIRUBIN UA: NEGATIVE
Glucose, UA: NEGATIVE
KETONES UA: NEGATIVE
Nitrite, UA: NEGATIVE
PROTEIN UA: NEGATIVE
RBC, UA: NEGATIVE
Specific Gravity, UA: 1.021 (ref 1.005–1.030)
Urobilinogen, Ur: 0.2 mg/dL (ref 0.2–1.0)
pH, UA: 6 (ref 5.0–7.5)

## 2018-04-17 LAB — URINE CULTURE, REFLEX

## 2018-05-04 DIAGNOSIS — H01003 Unspecified blepharitis right eye, unspecified eyelid: Secondary | ICD-10-CM | POA: Diagnosis not present

## 2018-05-06 ENCOUNTER — Other Ambulatory Visit: Payer: Self-pay | Admitting: Nurse Practitioner

## 2018-05-06 ENCOUNTER — Telehealth: Payer: Self-pay | Admitting: Nurse Practitioner

## 2018-05-06 DIAGNOSIS — E559 Vitamin D deficiency, unspecified: Secondary | ICD-10-CM

## 2018-05-06 MED ORDER — ERGOCALCIFEROL 1.25 MG (50000 UT) PO CAPS: 50000.0000 [IU] | ORAL_CAPSULE | ORAL | 5 refills | Status: AC

## 2018-05-06 NOTE — Progress Notes (Signed)
Vitamin d deficiency present on labs. Added drisdol 50000iu once weekly for next 6 months.

## 2018-05-06 NOTE — Telephone Encounter (Signed)
Please let her know that her labs look good overall. Vitamin d is a little low. I suggest she take OTC vitamin d supplement every day. She should take 1000iu every day. Other labs good. Thanks .

## 2018-05-06 NOTE — Telephone Encounter (Signed)
Added drisdol 50000iu weekly for next 6 months. New prescription sent to her pharmacy.

## 2018-05-06 NOTE — Telephone Encounter (Signed)
Pt states that she was taken vitamin d 2000iu a day and its still dropped she is wondering if she needs the rx strength

## 2018-05-07 NOTE — Telephone Encounter (Signed)
PT WAS NOTIFIED. 

## 2018-06-15 ENCOUNTER — Ambulatory Visit (INDEPENDENT_AMBULATORY_CARE_PROVIDER_SITE_OTHER): Payer: Medicare Other

## 2018-06-15 DIAGNOSIS — Z23 Encounter for immunization: Secondary | ICD-10-CM | POA: Diagnosis not present

## 2018-06-18 ENCOUNTER — Telehealth: Payer: Self-pay

## 2018-06-18 NOTE — Telephone Encounter (Signed)
Called pt to inform her that her MR are available to pick up. Pt said she will pick up next week.

## 2018-06-21 DIAGNOSIS — H401411 Capsular glaucoma with pseudoexfoliation of lens, right eye, mild stage: Secondary | ICD-10-CM | POA: Diagnosis not present

## 2018-06-21 DIAGNOSIS — H401492 Capsular glaucoma with pseudoexfoliation of lens, unspecified eye, moderate stage: Secondary | ICD-10-CM | POA: Diagnosis not present

## 2018-06-28 DIAGNOSIS — H401492 Capsular glaucoma with pseudoexfoliation of lens, unspecified eye, moderate stage: Secondary | ICD-10-CM | POA: Diagnosis not present

## 2018-07-02 ENCOUNTER — Telehealth: Payer: Self-pay

## 2018-07-02 NOTE — Telephone Encounter (Signed)
Called [facility] to check on pt bill that is being sent to her, gave different codes to run it through her insurance, spoke with [employer] and she stated that she submitted the new codes and that it will take 30-45 days. Also called the pt to inform her that any bill being sent out for this testing to disregard per Jillian ([facility] rep).

## 2018-07-13 ENCOUNTER — Other Ambulatory Visit: Payer: Self-pay

## 2018-07-13 MED ORDER — HYDROCHLOROTHIAZIDE 25 MG PO TABS: ORAL_TABLET | ORAL | 1 refills | Status: AC

## 2018-10-13 DIAGNOSIS — S29012A Strain of muscle and tendon of back wall of thorax, initial encounter: Secondary | ICD-10-CM | POA: Diagnosis not present

## 2018-10-19 ENCOUNTER — Ambulatory Visit: Payer: Self-pay | Admitting: Nurse Practitioner

## 2018-11-11 DIAGNOSIS — H401132 Primary open-angle glaucoma, bilateral, moderate stage: Secondary | ICD-10-CM | POA: Diagnosis not present

## 2018-11-11 DIAGNOSIS — H33302 Unspecified retinal break, left eye: Secondary | ICD-10-CM | POA: Diagnosis not present

## 2018-11-11 DIAGNOSIS — H04123 Dry eye syndrome of bilateral lacrimal glands: Secondary | ICD-10-CM | POA: Diagnosis not present

## 2018-12-02 DIAGNOSIS — H04123 Dry eye syndrome of bilateral lacrimal glands: Secondary | ICD-10-CM | POA: Diagnosis not present

## 2018-12-02 DIAGNOSIS — H401132 Primary open-angle glaucoma, bilateral, moderate stage: Secondary | ICD-10-CM | POA: Diagnosis not present

## 2018-12-30 DIAGNOSIS — H401132 Primary open-angle glaucoma, bilateral, moderate stage: Secondary | ICD-10-CM | POA: Diagnosis not present

## 2018-12-30 DIAGNOSIS — H04123 Dry eye syndrome of bilateral lacrimal glands: Secondary | ICD-10-CM | POA: Diagnosis not present

## 2019-04-18 ENCOUNTER — Ambulatory Visit: Payer: Self-pay | Admitting: Nurse Practitioner

## 2020-11-14 ENCOUNTER — Telehealth: Payer: Self-pay | Admitting: Cardiovascular Disease

## 2020-11-14 NOTE — Telephone Encounter (Signed)
3 attempts to schedule fu appt from recall list.   Deleting recall.
